# Patient Record
Sex: Female | Born: 1995 | Race: Black or African American | Hispanic: No | Marital: Single | Smoking: Current some day smoker
Health system: Southern US, Community
[De-identification: ages and names within clinical notes are randomized; demographics above are authoritative.]

## PROBLEM LIST (undated history)

## (undated) ENCOUNTER — Inpatient Hospital Stay (HOSPITAL_COMMUNITY): Payer: Self-pay

## (undated) DIAGNOSIS — Z789 Other specified health status: Secondary | ICD-10-CM

## (undated) HISTORY — DX: Other specified health status: Z78.9

## (undated) HISTORY — PX: NO PAST SURGERIES: SHX2092

---
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
## 2016-06-11 ENCOUNTER — Ambulatory Visit: Payer: Medicaid Other | Admitting: Family Medicine

## 2016-08-28 ENCOUNTER — Encounter (HOSPITAL_COMMUNITY): Payer: Self-pay | Admitting: *Deleted

## 2016-08-28 ENCOUNTER — Emergency Department (HOSPITAL_COMMUNITY)
Admission: EM | Admit: 2016-08-28 | Discharge: 2016-08-28 | Disposition: A | Discharge status: Home / Self Care | Payer: Self-pay | Attending: Emergency Medicine | Admitting: Emergency Medicine

## 2016-08-28 DIAGNOSIS — R202 Paresthesia of skin: Secondary | ICD-10-CM | POA: Insufficient documentation

## 2016-08-28 DIAGNOSIS — Z79899 Other long term (current) drug therapy: Secondary | ICD-10-CM | POA: Insufficient documentation

## 2016-08-28 LAB — RAPID URINE DRUG SCREEN, HOSP PERFORMED
AMPHETAMINES: NOT DETECTED
BENZODIAZEPINES: NOT DETECTED
Barbiturates: NOT DETECTED
Cocaine: NOT DETECTED
OPIATES: NOT DETECTED
TETRAHYDROCANNABINOL: POSITIVE — AB

## 2016-08-28 LAB — I-STAT CHEM 8, ED
BUN: 17 mg/dL (ref 6–20)
CALCIUM ION: 1.27 mmol/L (ref 1.15–1.40)
Chloride: 104 mmol/L (ref 101–111)
Creatinine, Ser: 0.6 mg/dL (ref 0.44–1.00)
Glucose, Bld: 105 mg/dL — ABNORMAL HIGH (ref 65–99)
HCT: 43 % (ref 36.0–46.0)
Hemoglobin: 14.6 g/dL (ref 12.0–15.0)
POTASSIUM: 3.6 mmol/L (ref 3.5–5.1)
Sodium: 140 mmol/L (ref 135–145)
TCO2: 25 mmol/L (ref 0–100)

## 2016-08-28 LAB — PREGNANCY, URINE: Preg Test, Ur: NEGATIVE

## 2016-08-28 NOTE — ED Triage Notes (Signed)
Patient is alert and oriented x4.  She is complaining of numbness in her toes that started earlier this morning.  Patient was calm on arrival to the ED but began to hyperventilate.  Patient continues to have numbness in her toes.  Patient has ROM of motion with all extremities but is still with her movements.

## 2016-08-28 NOTE — ED Provider Notes (Signed)
[facility]-EMERGENCY DEPT Provider Note   CSN: [id number] Arrival date & time: 08/28/16  0510     History   Chief Complaint Chief Complaint  Patient presents with  . Anxiety    HPI Brianna Dawson is a 20 y.o. female.  HPI Patient presents with numbness and weakness in her feet. States this morning she got home from work late. She smokes 2 "blacks". She was then going into her bedroom when the phone runoff. States she went to go get the phone and she had weakness and numbness in her legs. States she had trouble walking. States it felt as if they were asleep. States she became kind of anxious about this. No headache. No confusion. She has not had episodes like this before. No shortness of breath. No new medications. No recent illnesses. Did not involve her hands. States she felt as if she was syncopal and be able to get up. No neck pain. Back pain.   Past Medical History:  Diagnosis Date  . Medical history non-contributory     Patient Active Problem List   Diagnosis Date Noted  . Contraception, device intrauterine 10/14/2013  . Wheezing without diagnosis of asthma 06/29/2013    History reviewed. No pertinent surgical history.  OB History    Gravida Para Term Preterm AB Living   1 1 1  0 0 1   SAB TAB Ectopic Multiple Live Births   0 0 0 0 1       Home Medications    Prior to Admission medications   Medication Sig Start Date End Date Taking? Authorizing Provider  albuterol (PROVENTIL HFA;VENTOLIN HFA) 108 (90 BASE) MCG/ACT inhaler Inhale 2 puffs into the lungs every 6 (six) hours as needed for wheezing. Patient not taking: Reported on 08/28/2016 06/22/13   Marlis Edelson, CNM  cetirizine (ZYRTEC ALLERGY) 10 MG tablet Take 1 tablet (10 mg total) by mouth daily. Patient not taking: Reported on 08/28/2016 07/13/13   Marlis Edelson, CNM  ferrous sulfate 325 (65 FE) MG tablet Take 1 tablet (325 mg total) by mouth daily with breakfast. Patient not taking: Reported on 08/28/2016  07/06/13   Aviva Signs, CNM  fluticasone (FLOVENT HFA) 44 MCG/ACT inhaler Inhale 1 puff into the lungs 2 (two) times daily. Patient not taking: Reported on 08/28/2016 06/22/13   Marlis Edelson, CNM  ibuprofen (ADVIL,MOTRIN) 600 MG tablet Take 1 tablet (600 mg total) by mouth every 6 (six) hours. Patient not taking: Reported on 08/28/2016 09/15/13   Charlane Ferretti, MD  Prenatal Vit-Fe Fumarate-FA (PRENATAL COMPLETE) 14-0.4 MG TABS Take 1 tablet by mouth daily. Patient not taking: Reported on 08/28/2016 05/13/13   Marcellina Millin, MD    Family History Family History  Problem Relation Age of Onset  . Hypertension Maternal Grandfather     Social History Social History  Substance Use Topics  . Smoking status: Never Smoker  . Smokeless tobacco: Never Used  . Alcohol use No     Allergies   Review of patient's allergies indicates no known allergies.   Review of Systems Review of Systems  Constitutional: Negative for appetite change.  HENT: Negative for congestion.   Respiratory: Negative for shortness of breath.   Cardiovascular: Negative for chest pain.  Gastrointestinal: Negative for abdominal pain.  Genitourinary: Negative for flank pain.  Musculoskeletal: Negative for back pain.  Neurological: Positive for weakness and numbness.  Psychiatric/Behavioral: Negative for behavioral problems.     Physical Exam Updated Vital Signs BP 104/67 (BP Location:  Left Arm)   Pulse 98   Temp 98.6 F (37 C) (Oral)   Resp 18   Ht 5\' 6"  (1.676 m)   Wt 150 lb (68 kg)   LMP 07/29/2016 (Approximate)   SpO2 97%   BMI 24.21 kg/m   Physical Exam  Constitutional: She appears well-developed.  HENT:  Head: Atraumatic.  Eyes: EOM are normal.  Neck: Neck supple.  Cardiovascular: Normal rate.   Pulmonary/Chest: Effort normal.  Abdominal: Soft.  Musculoskeletal:  Good dorsalis pedis pulse in both feet. Good flexion-extension of the ankle. Patient states some pain with movement of the  calf. Good capillary refill. Paresthesias in a stocking distribution on both lower legs and feet. No increased reflexes.  Neurological: She is alert.  Skin: Skin is warm. Capillary refill takes less than 2 seconds.  Psychiatric: She has a normal mood and affect.     ED Treatments / Results  Labs (all labs ordered are listed, but only abnormal results are displayed) Labs Reviewed  RAPID URINE DRUG SCREEN, HOSP PERFORMED - Abnormal; Notable for the following:       Result Value   Tetrahydrocannabinol POSITIVE (*)    All other components within normal limits  I-STAT CHEM 8, ED - Abnormal; Notable for the following:    Glucose, Bld 105 (*)    All other components within normal limits  PREGNANCY, URINE    EKG  EKG Interpretation None       Radiology No results found.  Procedures Procedures (including critical care time)  Medications Ordered in ED Medications - No data to display   Initial Impression / Assessment and Plan / ED Course  I have reviewed the triage vital signs and the nursing notes.  Pertinent labs & imaging results that were available during my care of the patient were reviewed by me and considered in my medical decision making (see chart for details).  Clinical Course    Patient with paresthesias in her feet. Labs reassuring. Exam improving. Is a stocking distribution. No weakness. Will discharge home to follow-up with neurology as needed.  Final Clinical Impressions(s) / ED Diagnoses   Final diagnoses:  Paresthesias    New Prescriptions New Prescriptions   No medications on file     Benjiman CoreNathan Aurielle Slingerland, MD 08/28/16 [phone]

## 2016-08-28 NOTE — ED Notes (Signed)
Bed: WTR8 Expected date:  Expected time:  Means of arrival:  Comments: 

## 2016-08-28 NOTE — ED Notes (Signed)
Patient is alert and oriented x3.  She was given DC instructions and follow up visit instructions.  Patient gave verbal understanding. She was DC ambulatory under her own power to home.  V/S stable.  He was not showing any signs of distress on DC 

## 2017-11-03 NOTE — L&D Delivery Note (Addendum)
Patient: Brianna Dawson MRN: [id number]  GBS status: neg, IAP given n/a  Patient is a 22 y.o. now G2P2002 s/p NSVD at 6486w2d, who was admitted for SOL. SROM 1h 5822m prior to delivery with clear fluid.    Delivery Note At 1:28 AM a healthy female was delivered via Vaginal, Spontaneous (Presentation: compound R hand, ROA;  ).  APGAR: 8, 9; weight  pending.   Placenta status: spontaneous, intact. 3-vessel cord  Anesthesia:  none Episiotomy: None Lacerations: None Suture Repair: none Est. Blood Loss (mL): 50  Mom to postpartum.  Baby to Couplet care / Skin to Skin.  Denzil HughesKelsey D Ash 07/18/2018, 1:50 AM  Midwife attestation: I was gloved and present for delivery in its entirety and I agree with the above resident's note.  Donette LarryMelanie Yolanda Dockendorf, CNM 1:58 AM     Head delivered ROA. No nuchal cord present. Shoulder and body delivered in usual fashion. Infant with spontaneous cry, placed on mother's abdomen, dried and bulb suctioned. Cord clamped x 2 after 1-minute delay, and cut by family member. Cord blood drawn. Placenta delivered spontaneously with gentle cord traction. Fundus firm with massage and Pitocin. Perineum inspected and found to have no laceration.

## 2017-11-28 ENCOUNTER — Encounter (HOSPITAL_COMMUNITY): Payer: Self-pay | Admitting: Emergency Medicine

## 2017-11-28 ENCOUNTER — Emergency Department (HOSPITAL_COMMUNITY)
Admission: EM | Admit: 2017-11-28 | Discharge: 2017-11-28 | Disposition: A | Discharge status: Home / Self Care | Payer: Medicaid Other | Attending: Emergency Medicine | Admitting: Emergency Medicine

## 2017-11-28 DIAGNOSIS — Z3A01 Less than 8 weeks gestation of pregnancy: Secondary | ICD-10-CM | POA: Diagnosis not present

## 2017-11-28 DIAGNOSIS — O26891 Other specified pregnancy related conditions, first trimester: Secondary | ICD-10-CM | POA: Insufficient documentation

## 2017-11-28 LAB — LIPASE, BLOOD: Lipase: 26 U/L (ref 11–51)

## 2017-11-28 LAB — COMPREHENSIVE METABOLIC PANEL
ALBUMIN: 3.9 g/dL (ref 3.5–5.0)
ALT: 11 U/L — ABNORMAL LOW (ref 14–54)
ANION GAP: 7 (ref 5–15)
AST: 15 U/L (ref 15–41)
Alkaline Phosphatase: 51 U/L (ref 38–126)
BILIRUBIN TOTAL: 0.6 mg/dL (ref 0.3–1.2)
BUN: 11 mg/dL (ref 6–20)
CHLORIDE: 102 mmol/L (ref 101–111)
CO2: 25 mmol/L (ref 22–32)
Calcium: 9.1 mg/dL (ref 8.9–10.3)
Creatinine, Ser: 0.6 mg/dL (ref 0.44–1.00)
GFR calc Af Amer: >60 mL/min (ref >60)
GFR calc non Af Amer: >60 mL/min (ref >60)
GLUCOSE: 86 mg/dL (ref 65–99)
POTASSIUM: 3.8 mmol/L (ref 3.5–5.1)
SODIUM: 134 mmol/L — AB (ref 135–145)
TOTAL PROTEIN: 7.1 g/dL (ref 6.5–8.1)

## 2017-11-28 LAB — URINALYSIS, ROUTINE W REFLEX MICROSCOPIC
BILIRUBIN URINE: NEGATIVE
GLUCOSE, UA: NEGATIVE mg/dL
Hgb urine dipstick: NEGATIVE
Ketones, ur: NEGATIVE mg/dL
Leukocytes, UA: NEGATIVE
NITRITE: NEGATIVE
PH: 8 (ref 5.0–8.0)
Protein, ur: NEGATIVE mg/dL
SPECIFIC GRAVITY, URINE: 1.016 (ref 1.005–1.030)

## 2017-11-28 LAB — HCG, QUANTITATIVE, PREGNANCY: hCG, Beta Chain, Quant, S: 27958 m[IU]/mL — ABNORMAL HIGH (ref <5)

## 2017-11-28 LAB — CBC
HEMATOCRIT: 39.2 % (ref 36.0–46.0)
HEMOGLOBIN: 13.1 g/dL (ref 12.0–15.0)
MCH: 23.6 pg — ABNORMAL LOW (ref 26.0–34.0)
MCHC: 33.4 g/dL (ref 30.0–36.0)
MCV: 70.6 fL — AB (ref 78.0–100.0)
Platelets: 193 10*3/uL (ref 150–400)
RBC: 5.55 MIL/uL — ABNORMAL HIGH (ref 3.87–5.11)
RDW: 14.8 % (ref 11.5–15.5)
WBC: 7.8 10*3/uL (ref 4.0–10.5)

## 2017-11-28 NOTE — ED Notes (Signed)
Bed: WTR6 Expected date:  Expected time:  Means of arrival:  Comments: 

## 2017-11-28 NOTE — ED Triage Notes (Signed)
Patient reports that she recently found out that she was pregnant and hasnt gotten in to see MD yet. Reports intermittent lower abd cramping over past couple days with any vaginal bleeding, v/d or urinary problems. Reports some intermittent nausea.

## 2017-11-28 NOTE — Discharge Instructions (Signed)
If you develop pain or bleeding [facility].

## 2017-11-28 NOTE — ED Triage Notes (Signed)
Pt currently denies any vaginal bleeding, reports slight milky vaginal discharge. Nausea and cramping are intermittent

## 2017-11-28 NOTE — ED Provider Notes (Signed)
[facility]-EMERGENCY DEPT Provider Note   CSN: [id number] Arrival date & time: 11/28/17  1118     History   Chief Complaint Chief Complaint  Patient presents with  . Abdominal Cramping  . recently found out pregnant    HPI Brianna Dawson is a 22 y.o. G2P1001 early pregnant, LMP 10/16/17  who presents to the ED with abdominal cramping and nausea. Patient reports she just recently found out she was pregnant and has not started prenatal care. She describes the abdominal pain as cramping that comes and goes. The symptoms started 2 days ago. Patient denies vaginal bleeding or discharge, no UTI symptoms. No hx of STI and no concern for STI. Current sex partner x 3 years. Planned pregnancy. Patient denies pain currently.  HPI  Past Medical History:  Diagnosis Date  . Medical history non-contributory     Patient Active Problem List   Diagnosis Date Noted  . Contraception, device intrauterine 10/14/2013  . Wheezing without diagnosis of asthma 06/29/2013    History reviewed. No pertinent surgical history.  OB History    Gravida Para Term Preterm AB Living   2 1 1  0 0 1   SAB TAB Ectopic Multiple Live Births   0 0 0 0 1       Home Medications    Prior to Admission medications   Medication Sig Start Date End Date Taking? Authorizing Provider  ferrous sulfate 325 (65 FE) MG tablet Take 1 tablet (325 mg total) by mouth daily with breakfast. Patient not taking: Reported on 08/28/2016 07/06/13   Aviva SignsWilliams, Marie L, CNM  Prenatal Vit-Fe Fumarate-FA (PRENATAL COMPLETE) 14-0.4 MG TABS Take 1 tablet by mouth daily. Patient not taking: Reported on 08/28/2016 05/13/13   Marcellina MillinGaley, Timothy, MD    Family History Family History  Problem Relation Age of Onset  . Hypertension Maternal Grandfather     Social History Social History   Tobacco Use  . Smoking status: Never Smoker  . Smokeless tobacco: Never Used  Substance Use Topics  . Alcohol use: No  . Drug use: No       Allergies   Patient has no known allergies.   Review of Systems Review of Systems  Constitutional: Negative for chills.  HENT: Negative.   Eyes: Negative for visual disturbance.  Respiratory: Negative for cough and shortness of breath.   Cardiovascular: Negative for chest pain.  Gastrointestinal: Positive for nausea. Negative for vomiting. Abdominal pain: cramping.  Genitourinary: Positive for frequency. Negative for dysuria, urgency, vaginal bleeding and vaginal discharge.  Musculoskeletal: Negative for back pain.  Skin: Negative for rash and wound.  Neurological: Negative for syncope and headaches.  Psychiatric/Behavioral: Negative for confusion.     Physical Exam Updated Vital Signs BP 127/69 (BP Location: Left Arm)   Pulse 86   Temp 98 F (36.7 C) (Oral)   Resp 18   LMP 11/16/2016   SpO2 99%   Physical Exam  Constitutional: She is oriented to person, place, and time. She appears well-developed and well-nourished. No distress.  HENT:  Head: Normocephalic and atraumatic.  Eyes: Conjunctivae and EOM are normal.  Neck: Normal range of motion. Neck supple.  Cardiovascular: Normal rate and regular rhythm.  Pulmonary/Chest: Effort normal and breath sounds normal.  Abdominal: Soft. Bowel sounds are normal. There is no tenderness.  Musculoskeletal: Normal range of motion.  Neurological: She is alert and oriented to person, place, and time. No cranial nerve deficit.  Skin: Skin is warm and dry.  Psychiatric:  She has a normal mood and affect. Her behavior is normal.  Nursing note and vitals reviewed.    ED Treatments / Results  Labs (all labs ordered are listed, but only abnormal results are displayed) Labs Reviewed  COMPREHENSIVE METABOLIC PANEL - Abnormal; Notable for the following components:      Result Value   Sodium 134 (*)    ALT 11 (*)    All other components within normal limits  CBC - Abnormal; Notable for the following components:   RBC 5.55 (*)     MCV 70.6 (*)    MCH 23.6 (*)    All other components within normal limits  HCG, QUANTITATIVE, PREGNANCY - Abnormal; Notable for the following components:   hCG, Beta Chain, Quant, S 27,958 (*)    All other components within normal limits  LIPASE, BLOOD  URINALYSIS, ROUTINE W REFLEX MICROSCOPIC    Radiology No results found.  Procedures Procedures (including critical care time)  Medications Ordered in ED Medications - No data to display   Initial Impression / Assessment and Plan / ED Course  I have reviewed the triage vital signs and the nursing notes. 21 y.o. G2P1001 @ 6 weeks 1 day gestation by LMP with occasional lower abdominal cramping and no pain currently. Offered patient ultrasound due to the cramping. Patient declined ultrasound stating she is not having pain now and needs to leave to go to work. She does want to know how far along she is and get a pregnancy verification letter. Patient given information that with LMP of 10/16/17 she would be 6 weeks and 1 day pregnant. Pregnancy verification letter given. Patient appears safe for d/c. Return precautions discussed. If patient develops problems she will go to [facility] hospital.   Final Clinical Impressions(s) / ED Diagnoses   Final diagnoses:  Less than [redacted] weeks gestation of pregnancy    ED Discharge Orders    None       Kerrie Buffalo Axson, NP 11/28/17 1426    Lorre Nick, MD 11/29/17 1004

## 2017-12-03 ENCOUNTER — Telehealth: Payer: Self-pay | Admitting: Family Medicine

## 2017-12-03 NOTE — Telephone Encounter (Signed)
Patient called [facility] to make an appointment. Called patient back to schedule, and no answer.

## 2017-12-22 ENCOUNTER — Encounter: Payer: Self-pay | Admitting: Obstetrics and Gynecology

## 2017-12-22 ENCOUNTER — Ambulatory Visit: Payer: Self-pay

## 2017-12-22 ENCOUNTER — Ambulatory Visit (INDEPENDENT_AMBULATORY_CARE_PROVIDER_SITE_OTHER): Payer: Medicaid Other | Admitting: Obstetrics and Gynecology

## 2017-12-22 ENCOUNTER — Other Ambulatory Visit (HOSPITAL_COMMUNITY)
Admission: RE | Admit: 2017-12-22 | Discharge: 2017-12-22 | Disposition: A | Discharge status: Home / Self Care | Payer: Medicaid Other | Source: Ambulatory Visit | Attending: Obstetrics and Gynecology | Admitting: Obstetrics and Gynecology

## 2017-12-22 VITALS — BP 113/64 | HR 87 | Wt 158.0 lb

## 2017-12-22 DIAGNOSIS — Z3491 Encounter for supervision of normal pregnancy, unspecified, first trimester: Secondary | ICD-10-CM

## 2017-12-22 DIAGNOSIS — Z3490 Encounter for supervision of normal pregnancy, unspecified, unspecified trimester: Secondary | ICD-10-CM | POA: Insufficient documentation

## 2017-12-22 NOTE — Patient Instructions (Signed)
First Trimester of Pregnancy The first trimester of pregnancy is from week 1 until the end of week 13 (months 1 through 3). During this time, your baby will begin to develop inside you. At 6-8 weeks, the eyes and face are formed, and the heartbeat can be seen on ultrasound. At the end of 12 weeks, all the baby's organs are formed. Prenatal care is all the medical care you receive before the birth of your baby. Make sure you get good prenatal care and follow all of your doctor's instructions. Follow these instructions at home: Medicines  Take over-the-counter and prescription medicines only as told by your doctor. Some medicines are safe and some medicines are not safe during pregnancy.  Take a prenatal vitamin that contains at least 600 micrograms (mcg) of folic acid.  If you have trouble pooping (constipation), take medicine that will make your stool soft (stool softener) if your doctor approves. Eating and drinking  Eat regular, healthy meals.  Your doctor will tell you the amount of weight gain that is right for you.  Avoid raw meat and uncooked cheese.  If you feel sick to your stomach (nauseous) or throw up (vomit): ? Eat 4 or 5 small meals a day instead of 3 large meals. ? Try eating a few soda crackers. ? Drink liquids between meals instead of during meals.  To prevent constipation: ? Eat foods that are high in fiber, like fresh fruits and vegetables, whole grains, and beans. ? Drink enough fluids to keep your pee (urine) clear or pale yellow. Activity  Exercise only as told by your doctor. Stop exercising if you have cramps or pain in your lower belly (abdomen) or low back.  Do not exercise if it is too hot, too humid, or if you are in a place of great height (high altitude).  Try to avoid standing for long periods of time. Move your legs often if you must stand in one place for a long time.  Avoid heavy lifting.  Wear low-heeled shoes. Sit and stand up straight.  You  can have sex unless your doctor tells you not to. Relieving pain and discomfort  Wear a good support bra if your breasts are sore.  Take warm water baths (sitz baths) to soothe pain or discomfort caused by hemorrhoids. Use hemorrhoid cream if your doctor says it is okay.  Rest with your legs raised if you have leg cramps or low back pain.  If you have puffy, bulging veins (varicose veins) in your legs: ? Wear support hose or compression stockings as told by your doctor. ? Raise (elevate) your feet for 15 minutes, 3-4 times a day. ? Limit salt in your food. Prenatal care  Schedule your prenatal visits by the twelfth week of pregnancy.  Write down your questions. Take them to your prenatal visits.  Keep all your prenatal visits as told by your doctor. This is important. Safety  Wear your seat belt at all times when driving.  Make a list of emergency phone numbers. The list should include numbers for family, friends, the hospital, and police and fire departments. General instructions  Ask your doctor for a referral to a local prenatal class. Begin classes no later than at the start of month 6 of your pregnancy.  Ask for help if you need counseling or if you need help with nutrition. Your doctor can give you advice or tell you where to go for help.  Do not use hot tubs, steam rooms, or   saunas.  Do not douche or use tampons or scented sanitary pads.  Do not cross your legs for long periods of time.  Avoid all herbs and alcohol. Avoid drugs that are not approved by your doctor.  Do not use any tobacco products, including cigarettes, chewing tobacco, and electronic cigarettes. If you need help quitting, ask your doctor. You may get counseling or other support to help you quit.  Avoid cat litter boxes and soil used by cats. These carry germs that can cause birth defects in the baby and can cause a loss of your baby (miscarriage) or stillbirth.  Visit your dentist. At home, brush  your teeth with a soft toothbrush. Be gentle when you floss. Contact a doctor if:  You are dizzy.  You have mild cramps or pressure in your lower belly.  You have a nagging pain in your belly area.  You continue to feel sick to your stomach, you throw up, or you have watery poop (diarrhea).  You have a bad smelling fluid coming from your vagina.  You have pain when you pee (urinate).  You have increased puffiness (swelling) in your face, hands, legs, or ankles. Get help right away if:  You have a fever.  You are leaking fluid from your vagina.  You have spotting or bleeding from your vagina.  You have very bad belly cramping or pain.  You gain or lose weight rapidly.  You throw up blood. It may look like coffee grounds.  You are around people who have German measles, fifth disease, or chickenpox.  You have a very bad headache.  You have shortness of breath.  You have any kind of trauma, such as from a fall or a car accident. Summary  The first trimester of pregnancy is from week 1 until the end of week 13 (months 1 through 3).  To take care of yourself and your unborn baby, you will need to eat healthy meals, take medicines only if your doctor tells you to do so, and do activities that are safe for you and your baby.  Keep all follow-up visits as told by your doctor. This is important as your doctor will have to ensure that your baby is healthy and growing well. This information is not intended to replace advice given to you by your health care provider. Make sure you discuss any questions you have with your health care provider. Document Released: 04/07/2008 Document Revised: 10/28/2016 Document Reviewed: 10/28/2016 Elsevier Interactive Patient Education  2017 Elsevier Inc.  

## 2017-12-22 NOTE — Progress Notes (Signed)
Bedside ultrasound reveals FHR 160

## 2017-12-22 NOTE — Progress Notes (Signed)
New OB Note  12/22/2017   CC:  Chief Complaint  Patient presents with  . Initial Prenatal Visit    Transfer of Care Patient: no  History of Present Illness: Brianna Dawson is a 22 y.o. G2P1001 at 586w4d by estimated LMP being seen today for her first obstetrical visit. Her obstetrical history is significant for none. This was a planned pregnancy. Patient does intend to breast feed. Patient undecided for contraception after completion of pregnancy. Pregnancy history fully reviewed.  Her periods were: regular  She was using no method when she conceived.  She has Negative signs or symptoms of nausea/vomiting of pregnancy. She has Negative signs or symptoms of miscarriage or preterm labor  Patient reports no complaints.  Any prior children are healthy, doing well, without any problems or issues: no Complications in prior pregnancies: no  Complications in prior deliveries: no   ROS: A 12-point review of systems was performed and negative, except as stated in the above HPI.   OBGYN History: OB History  Gravida Para Term Preterm AB Living  2 1 1  0 0 1  SAB TAB Ectopic Multiple Live Births  0 0 0 0 1    # Outcome Date GA Lbr Len/2nd Weight Sex Delivery Anes PTL Lv  2 Current           1 Term 09/14/13 3166w1d 16:02 / 00:12 7 lb 1.8 oz (3.226 kg) F Vag-Spont EPI  LIV      GYN Hx: Never had one <22yo  Past Medical History: Past Medical History:  Diagnosis Date  . Medical history non-contributory     Past Surgical History: History reviewed. No pertinent surgical history.  Family History:  Family History  Problem Relation Age of Onset  . Hypertension Maternal Grandfather     Social History:  Social History   Socioeconomic History  . Marital status: Single    Spouse name: Not on file  . Number of children: Not on file  . Years of education: Not on file  . Highest education level: Not on file  Social Needs  . Financial resource strain: Not on file  . Food  insecurity - worry: Not on file  . Food insecurity - inability: Not on file  . Transportation needs - medical: Not on file  . Transportation needs - non-medical: Not on file  Occupational History  . Not on file  Tobacco Use  . Smoking status: Former Games developermoker  . Smokeless tobacco: Never Used  Substance and Sexual Activity  . Alcohol use: No  . Drug use: No  . Sexual activity: Yes    Birth control/protection: None  Other Topics Concern  . Not on file  Social History Narrative  . Not on file    Allergy: No Known Allergies  Current Outpatient Medications:  Current Outpatient Medications:  .  ferrous sulfate 325 (65 FE) MG tablet, Take 1 tablet (325 mg total) by mouth daily with breakfast. (Patient not taking: Reported on 08/28/2016), Disp: 30 tablet, Rfl: 2 .  Prenatal Vit-Fe Fumarate-FA (PRENATAL COMPLETE) 14-0.4 MG TABS, Take 1 tablet by mouth daily. (Patient not taking: Reported on 08/28/2016), Disp: 30 each, Rfl: 5  Physical Exam:   BP 113/64   Pulse 87   Wt 158 lb (71.7 kg)   LMP 10/16/2017 (Approximate)   BMI 25.50 kg/m  Body mass index is 25.5 kg/m. Contractions: Not present Vag. Bleeding: None. FHTs: 160bpm via bedside US  General appearance: Well nourished, well developed female  in no acute distress.  Neck:  Supple, normal appearance, and no thyromegaly  Cardiovascular: regular rate and rhythm Respiratory:  Clear to auscultation bilateral. Normal respiratory effort Abdomen: positive bowel sounds and no masses, hernias; diffusely non tender to palpation, non distended Breasts: breasts appear normal, no suspicious masses, no skin or nipple changes or axillary nodes. Neuro/Psych:  Normal mood and affect.  Skin:  Warm and dry.  Lymphatic:  No inguinal lymphadenopathy.  Genitalia:  Normal introitus for age, no external lesions, no vaginal discharge, mucosa pink and moist, no vaginal or cervical lesions, no vaginal atrophy, no friaility or hemorrhage, normal uterus  size and position, no adnexal masses or tenderness  Assessment/Plan: G2P1001 [redacted]w[redacted]d  1. Encounter for supervision of low-risk pregnancy in first trimester - Obstetric Panel, Including HIV - SMN1 COPY NUMBER ANALYSIS (SMA Carrier Screen) - Hemoglobinopathy Evaluation - Culture, OB Urine - US OB Limited; Future - GC/Chlamydia probe amp ([place])not at [facility] - US OB Transvaginal; Future   Initial labs drawn. Continue prenatal vitamins. Genetic Screening discussed, First trimester screen: ordered. Ultrasound discussed; fetal anatomic survey: requested at future date. Order placed for dating [place] since patient with unknown LMP Problem list reviewed and updated. The nature of [facility] with multiple MDs and other Advanced Practice Providers was explained to patient; also emphasized that residents, students are part of our team. Routine obstetric precautions reviewed. Return in about 4 weeks (around 01/19/2018) for ob visit.    Caryl Ada, DO OB Fellow Center for [facility], [facility]

## 2017-12-23 ENCOUNTER — Encounter: Payer: Self-pay | Admitting: Obstetrics and Gynecology

## 2017-12-23 LAB — GC/CHLAMYDIA PROBE AMP (~~LOC~~) NOT AT ARMC
CHLAMYDIA, DNA PROBE: NEGATIVE
Neisseria Gonorrhea: NEGATIVE

## 2017-12-24 LAB — URINE CULTURE, OB REFLEX: Organism ID, Bacteria: NO GROWTH

## 2017-12-24 LAB — CULTURE, OB URINE

## 2017-12-25 ENCOUNTER — Ambulatory Visit (HOSPITAL_COMMUNITY): Admission: RE | Admit: 2017-12-25 | Payer: Medicaid Other | Source: Ambulatory Visit

## 2017-12-31 LAB — OBSTETRIC PANEL, INCLUDING HIV
ANTIBODY SCREEN: NEGATIVE
Basophils Absolute: 0 10*3/uL (ref 0.0–0.2)
Basos: 0 %
EOS (ABSOLUTE): 0.1 10*3/uL (ref 0.0–0.4)
Eos: 2 %
HIV Screen 4th Generation wRfx: NONREACTIVE
Hematocrit: 41.8 % (ref 34.0–46.6)
Hemoglobin: 13.3 g/dL (ref 11.1–15.9)
Hepatitis B Surface Ag: NEGATIVE
IMMATURE GRANS (ABS): 0 10*3/uL (ref 0.0–0.1)
IMMATURE GRANULOCYTES: 0 %
LYMPHS ABS: 2 10*3/uL (ref 0.7–3.1)
LYMPHS: 27 %
MCH: 23.5 pg — ABNORMAL LOW (ref 26.6–33.0)
MCHC: 31.8 g/dL (ref 31.5–35.7)
MCV: 74 fL — ABNORMAL LOW (ref 79–97)
MONOS ABS: 0.6 10*3/uL (ref 0.1–0.9)
Monocytes: 8 %
NEUTROS PCT: 63 %
Neutrophils Absolute: 4.4 10*3/uL (ref 1.4–7.0)
Platelets: 294 10*3/uL (ref 150–379)
RBC: 5.66 x10E6/uL — AB (ref 3.77–5.28)
RDW: 15.2 % (ref 12.3–15.4)
RPR Ser Ql: NONREACTIVE
Rh Factor: POSITIVE
Rubella Antibodies, IGG: 12.7 index (ref >0.99)
WBC: 7.1 10*3/uL (ref 3.4–10.8)

## 2017-12-31 LAB — HEMOGLOBINOPATHY EVALUATION
Ferritin: 289 ng/mL — ABNORMAL HIGH (ref 15–150)
HGB A: 97.5 % (ref 96.4–98.8)
HGB S: 0 %
HGB SOLUBILITY: NEGATIVE
Hgb A2 Quant: 2.5 % (ref 1.8–3.2)
Hgb C: 0 %
Hgb F Quant: 0 % (ref 0.0–2.0)
Hgb Variant: 0 %

## 2017-12-31 LAB — SMN1 COPY NUMBER ANALYSIS (SMA CARRIER SCREENING)

## 2017-12-31 LAB — ALPHA-THALASSEMIA

## 2018-01-08 ENCOUNTER — Encounter: Payer: Self-pay | Admitting: Family Medicine

## 2018-01-08 DIAGNOSIS — Z3491 Encounter for supervision of normal pregnancy, unspecified, first trimester: Secondary | ICD-10-CM

## 2018-01-18 ENCOUNTER — Telehealth: Payer: Self-pay | Admitting: Advanced Practice Midwife

## 2018-01-18 ENCOUNTER — Encounter: Payer: Self-pay | Admitting: Advanced Practice Midwife

## 2018-01-18 NOTE — Telephone Encounter (Signed)
Called patient about her missed appointment. Sending certified letter for rescheduled date and time.

## 2018-01-28 ENCOUNTER — Encounter: Payer: Medicaid Other | Admitting: Student

## 2018-02-18 ENCOUNTER — Encounter: Payer: Medicaid Other | Admitting: Student

## 2018-02-19 ENCOUNTER — Encounter (HOSPITAL_COMMUNITY): Payer: Self-pay | Admitting: Emergency Medicine

## 2018-02-19 ENCOUNTER — Emergency Department (HOSPITAL_COMMUNITY)
Admission: EM | Admit: 2018-02-19 | Discharge: 2018-02-19 | Discharge status: Against Medical Advice | Payer: Medicaid Other | Attending: Emergency Medicine | Admitting: Emergency Medicine

## 2018-02-19 DIAGNOSIS — Z5321 Procedure and treatment not carried out due to patient leaving prior to being seen by health care provider: Secondary | ICD-10-CM | POA: Insufficient documentation

## 2018-02-19 DIAGNOSIS — R109 Unspecified abdominal pain: Secondary | ICD-10-CM | POA: Diagnosis present

## 2018-02-19 LAB — URINALYSIS, ROUTINE W REFLEX MICROSCOPIC
BILIRUBIN URINE: NEGATIVE
Glucose, UA: NEGATIVE mg/dL
Hgb urine dipstick: NEGATIVE
KETONES UR: 5 mg/dL — AB
Nitrite: NEGATIVE
PH: 5 (ref 5.0–8.0)
Protein, ur: 30 mg/dL — AB
Specific Gravity, Urine: 1.033 — ABNORMAL HIGH (ref 1.005–1.030)

## 2018-02-19 LAB — HCG, QUANTITATIVE, PREGNANCY: HCG, BETA CHAIN, QUANT, S: 35309 m[IU]/mL — AB (ref <5)

## 2018-02-19 NOTE — ED Notes (Signed)
Per Toniann FailWendy AD, patient reports she is leaving.

## 2018-02-19 NOTE — ED Triage Notes (Signed)
Pt reports for over 2 weeks she had pressure in bladder when she urinates and right before. reports around [redacted] weeks pregnant and missed her last 2 OB visits.

## 2018-03-08 ENCOUNTER — Ambulatory Visit (INDEPENDENT_AMBULATORY_CARE_PROVIDER_SITE_OTHER): Payer: Medicaid Other | Admitting: Advanced Practice Midwife

## 2018-03-08 VITALS — BP 110/56 | HR 77 | Wt 167.3 lb

## 2018-03-08 DIAGNOSIS — Z3482 Encounter for supervision of other normal pregnancy, second trimester: Secondary | ICD-10-CM

## 2018-03-08 DIAGNOSIS — R109 Unspecified abdominal pain: Secondary | ICD-10-CM

## 2018-03-08 DIAGNOSIS — O26892 Other specified pregnancy related conditions, second trimester: Secondary | ICD-10-CM

## 2018-03-08 MED ORDER — PRENATAL COMPLETE 14-0.4 MG PO TABS: 1.0000 | ORAL_TABLET | Freq: Every day | ORAL | 10 refills | Status: DC

## 2018-03-08 NOTE — Progress Notes (Signed)
Pt needs Rx for prenatal vitamins

## 2018-03-08 NOTE — Progress Notes (Signed)
   PRENATAL VISIT NOTE  Subjective:  Brianna Dawson is a 22 y.o. G2P1001 at [redacted]w[redacted]d being seen today for ongoing prenatal care.  She is currently monitored for the following issues for this low-risk pregnancy and has Supervision of low-risk pregnancy on their problem list.  Patient reports intermittent sharp lower abdominal pain 2-3 times daily before she urinates, pain resolves after urination.  Contractions: Not present. Vag. Bleeding: None.  Movement: Present. Denies leaking of fluid.   The following portions of the patient's history were reviewed and updated as appropriate: allergies, current medications, past family history, past medical history, past social history, past surgical history and problem list. Problem list updated.  Objective:   Vitals:   03/08/18 1720  BP: (!) 110/56  Pulse: 77  Weight: 167 lb 4.8 oz (75.9 kg)    Fetal Status: Fetal Heart Rate (bpm): 143   Movement: Present     General:  Alert, oriented and cooperative. Patient is in no acute distress.  Skin: Skin is warm and dry. No rash noted.   Cardiovascular: Normal heart rate noted  Respiratory: Normal respiratory effort, no problems with respiration noted  Abdomen: Soft, gravid, appropriate for gestational age.  Pain/Pressure: Present     Pelvic: Cervical exam deferred        Extremities: Normal range of motion.  Edema: None  Mental Status: Normal mood and affect. Normal behavior. Normal judgment and thought content.   Assessment and Plan:  Pregnancy: G2P1001 at [redacted]w[redacted]d  1. Encounter for supervision of other normal pregnancy in second trimester  - [place] MFM OB COMP + 14 WK; Future  2. Abdominal pain during pregnancy in second trimester --Pain is intermittent, 2-3 times daily and is associated with a full bladder. U/A done today in the office.  Pt was dehydrated at recent MAU visit so encouraged her to increase PO fluids.  Preterm labor symptoms and general obstetric precautions including but not limited to  vaginal bleeding, contractions, leaking of fluid and fetal movement were reviewed in detail with the patient. Please refer to After Visit Summary for other counseling recommendations.  Return in about 1 month (around 04/05/2018).  Future Appointments  Date Time Provider Department Center  04/08/2018  3:35 PM Burleson, Brand Males, NP [facility]    Sharen Counter, CNM

## 2018-03-09 ENCOUNTER — Encounter: Payer: Self-pay | Admitting: *Deleted

## 2018-03-22 ENCOUNTER — Ambulatory Visit (HOSPITAL_COMMUNITY)
Admission: RE | Admit: 2018-03-22 | Discharge: 2018-03-22 | Disposition: A | Discharge status: Home / Self Care | Payer: Medicaid Other | Source: Ambulatory Visit | Attending: Advanced Practice Midwife | Admitting: Advanced Practice Midwife

## 2018-03-22 DIAGNOSIS — Z3490 Encounter for supervision of normal pregnancy, unspecified, unspecified trimester: Secondary | ICD-10-CM

## 2018-03-22 DIAGNOSIS — Z3482 Encounter for supervision of other normal pregnancy, second trimester: Secondary | ICD-10-CM | POA: Diagnosis present

## 2018-03-22 DIAGNOSIS — Z3689 Encounter for other specified antenatal screening: Secondary | ICD-10-CM | POA: Insufficient documentation

## 2018-03-22 DIAGNOSIS — Z3A22 22 weeks gestation of pregnancy: Secondary | ICD-10-CM | POA: Diagnosis not present

## 2018-03-22 DIAGNOSIS — Z363 Encounter for antenatal screening for malformations: Secondary | ICD-10-CM | POA: Diagnosis not present

## 2018-03-24 ENCOUNTER — Encounter: Payer: Self-pay | Admitting: Advanced Practice Midwife

## 2018-03-24 DIAGNOSIS — IMO0002 Reserved for concepts with insufficient information to code with codable children: Secondary | ICD-10-CM | POA: Insufficient documentation

## 2018-03-24 DIAGNOSIS — O358XX0 Maternal care for other (suspected) fetal abnormality and damage, not applicable or unspecified: Secondary | ICD-10-CM | POA: Insufficient documentation

## 2018-04-08 ENCOUNTER — Encounter: Payer: Medicaid Other | Admitting: Nurse Practitioner

## 2018-06-28 ENCOUNTER — Telehealth: Payer: Self-pay | Admitting: Family Medicine

## 2018-06-28 ENCOUNTER — Encounter: Payer: Self-pay | Admitting: *Deleted

## 2018-06-28 ENCOUNTER — Encounter: Payer: Self-pay | Admitting: Family Medicine

## 2018-06-28 DIAGNOSIS — O358XX0 Maternal care for other (suspected) fetal abnormality and damage, not applicable or unspecified: Secondary | ICD-10-CM

## 2018-06-28 DIAGNOSIS — O35BXX0 Maternal care for other (suspected) fetal abnormality and damage, fetal cardiac anomalies, not applicable or unspecified: Secondary | ICD-10-CM

## 2018-06-28 DIAGNOSIS — Z3490 Encounter for supervision of normal pregnancy, unspecified, unspecified trimester: Secondary | ICD-10-CM

## 2018-06-28 NOTE — Telephone Encounter (Signed)
I called pt and informed her that because she has not been seen since 5/6 she has missed a lot of prenatal care. When she comes for appt on 9/10 she should be prepared to stay for 2 hours. Also she will need follow up US to review anatomy of baby before the 9/10 appt. She will be notified of US appt via MyChart. Pt agreed and voiced understanding of all instructions and information given.

## 2018-06-28 NOTE — Telephone Encounter (Signed)
Patient called to say she needed to get a referral to see a dentist. She also stated she knew she had missed a lot of her appointments. However, due to needing throes days after she delivered, she did not want to come to any appointments. She understands this is not the best thing for her and the baby, but she needs to have time after the baby comes. She wanted to know what all she had missed. I told her I would have a nurse call her back with this information. Because I'm not clinical I did not have throes answerers. I informed her I would get a nurse to call her and speak to her about this. She requested to have her next appointment September 10th.

## 2018-07-08 ENCOUNTER — Ambulatory Visit (HOSPITAL_COMMUNITY): Payer: Medicaid Other

## 2018-07-13 ENCOUNTER — Other Ambulatory Visit: Payer: Self-pay

## 2018-07-13 ENCOUNTER — Ambulatory Visit (INDEPENDENT_AMBULATORY_CARE_PROVIDER_SITE_OTHER): Payer: Medicaid Other | Admitting: Student

## 2018-07-13 ENCOUNTER — Other Ambulatory Visit: Payer: Self-pay | Admitting: Student

## 2018-07-13 ENCOUNTER — Ambulatory Visit (HOSPITAL_COMMUNITY)
Admission: RE | Admit: 2018-07-13 | Discharge: 2018-07-13 | Disposition: A | Discharge status: Home / Self Care | Payer: Medicaid Other | Source: Ambulatory Visit | Attending: Family Medicine | Admitting: Family Medicine

## 2018-07-13 VITALS — BP 119/62 | HR 79 | Wt 187.5 lb

## 2018-07-13 DIAGNOSIS — O358XX0 Maternal care for other (suspected) fetal abnormality and damage, not applicable or unspecified: Secondary | ICD-10-CM | POA: Insufficient documentation

## 2018-07-13 DIAGNOSIS — Z3493 Encounter for supervision of normal pregnancy, unspecified, third trimester: Secondary | ICD-10-CM | POA: Diagnosis not present

## 2018-07-13 DIAGNOSIS — Z23 Encounter for immunization: Secondary | ICD-10-CM | POA: Diagnosis not present

## 2018-07-13 DIAGNOSIS — Z113 Encounter for screening for infections with a predominantly sexual mode of transmission: Secondary | ICD-10-CM | POA: Diagnosis not present

## 2018-07-13 DIAGNOSIS — Z3A38 38 weeks gestation of pregnancy: Secondary | ICD-10-CM | POA: Diagnosis not present

## 2018-07-13 DIAGNOSIS — Z3490 Encounter for supervision of normal pregnancy, unspecified, unspecified trimester: Secondary | ICD-10-CM

## 2018-07-13 DIAGNOSIS — O35BXX0 Maternal care for other (suspected) fetal abnormality and damage, fetal cardiac anomalies, not applicable or unspecified: Secondary | ICD-10-CM

## 2018-07-13 DIAGNOSIS — IMO0001 Reserved for inherently not codable concepts without codable children: Secondary | ICD-10-CM

## 2018-07-13 NOTE — Progress Notes (Signed)
   PRENATAL VISIT NOTE  Subjective:  Brianna Dawson is a 22 y.o. G2P1001 at 101w4d being seen today for ongoing prenatal care.  She is currently monitored for the following issues for this low-risk pregnancy and has Supervision of low-risk pregnancy; [redacted] weeks gestation of pregnancy; Encounter for fetal anatomic survey; and Fetal cardiac echogenic focus on their problem list.  Patient reports no complaints. Patient has missed several appointments; states that she has been working.  Contractions: Not present. Vag. Bleeding: None.  Movement: Present. Denies leaking of fluid.   The following portions of the patient's history were reviewed and updated as appropriate: allergies, current medications, past family history, past medical history, past social history, past surgical history and problem list. Problem list updated.  Objective:   Vitals:   07/13/18 0835  BP: 119/62  Pulse: 79  Weight: 187 lb 8 oz (85 kg)    Fetal Status: Fetal Heart Rate (bpm): 136 Fundal Height: 36 cm Movement: Present  Presentation: Vertex  General:  Alert, oriented and cooperative. Patient is in no acute distress.  Skin: Skin is warm and dry. No rash noted.   Cardiovascular: Normal heart rate noted  Respiratory: Normal respiratory effort, no problems with respiration noted  Abdomen: Soft, gravid, appropriate for gestational age.  Pain/Pressure: Present     Pelvic: Cervical exam performed        Extremities: Normal range of motion.  Edema: Trace  Mental Status: Normal mood and affect. Normal behavior. Normal judgment and thought content.   Assessment and Plan:  Pregnancy: G2P1001 at [redacted]w[redacted]d  1. Encounter for supervision of low-risk pregnancy, antepartum -Patient does not want birth control right now, she has had mirena and didn't like it. She is scared of gaining weight with other methods.  - Culture, beta strep (group b only) - GC/Chlamydia probe amp ([place])not at [facility] - CBC - HIV antibody - RPR - Tdap  vaccine greater than or equal to 7yo IM - Glucose Tolerance, 2 Hours w/1 Hour  2. Fetal cardiac echogenic focus, single or unspecified fetus -follow up US today  Term labor symptoms and general obstetric precautions including but not limited to vaginal bleeding, contractions, leaking of fluid and fetal movement were reviewed in detail with the patient. Please refer to After Visit Summary for other counseling recommendations.  Return in about 1 week (around 07/20/2018), or LROB.  Future Appointments  Date Time Provider Department Center  07/13/2018  9:30 AM WOC-WOCA LAB WOC-WOCA WOC  07/13/2018 11:30 AM WH-MFC [place] 5 WH-MFCUS MFC-US    Marylene Land, CNM

## 2018-07-13 NOTE — Patient Instructions (Signed)
Hormonal Contraception Information °Hormonal contraception is a type of birth control that uses hormones to prevent pregnancy. It usually involves a combination of the hormones estrogen and progesterone or only the hormone progesterone. Hormonal contraception works in these ways: °· It thickens the mucus in the cervix, making it harder for sperm to enter the uterus. °· It changes the lining of the uterus, making it harder for an egg to implant. °· It may stop the ovaries from releasing eggs (ovulation). Some women who take hormonal contraceptives that contain only progesterone may continue to ovulate. ° °Hormonal contraception cannot prevent sexually transmitted infections (STIs). Pregnancy may still occur. °Estrogen and progesterone contraceptives °Contraceptives that use a combination of estrogen and progesterone are available in these forms: °· Pill. Pills come in different combinations of hormones. They must be taken at the same time each day. Pills can affect your period, causing you to get your period once every three months or not at all. °· Patch. The patch must be worn on the lower abdomen for three weeks and then removed on the fourth. °· Vaginal ring. The ring is placed in the vagina and left there for three weeks. It is then removed for one week. ° °Progesterone contraceptives °Contraceptives that use progesterone only are available in these forms: °· Pill. Pills should be taken every day of the cycle. °· Intrauterine device (IUD). This device is inserted into the uterus and removed or replaced every five years or sooner. °· Implant. Plastic rods are placed under the skin of the upper arm. They are removed or replaced every three years or sooner. °· Injection. The injection is given once every 90 days. ° °What are the side effects? °The side effects of estrogen and progesterone contraceptives include: °· Nausea. °· Headaches. °· Breast tenderness. °· Bleeding or spotting between menstrual cycles. °· High  blood pressure (rare). °· Strokes, heart attacks, or blood clots (rare) ° °Side effects of progesterone-only contraceptives include: °· Nausea. °· Headaches. °· Breast tenderness. °· Unpredictable menstrual bleeding. °· High blood pressure (rare). ° °Talk to your health care provider about what side effects may affect you. °Where to find more information: °· Ask your health care provider for more information and resources about hormonal contraception. °· U.S. Department of Health and Human Services Office on Women's Health: www.womenshealth.gov °Questions to ask: °· What type of hormonal contraception is right for me? °· How long should I plan to use hormonal contraception? °· What are the side effects of the hormonal contraception method I choose? °· How can I prevent STIs while using hormonal contraception? °Contact a health care provider if: °· You start taking hormonal contraceptives and you develop persistent or severe side effects. °Summary °· Estrogen and progesterone are hormones used in many forms of birth control. °· Talk to your health care provider about what side effects may affect you. °· Hormonal contraception cannot prevent sexually transmitted infections (STIs). °· Ask your health care provider for more information and resources about hormonal contraception. °This information is not intended to replace advice given to you by your health care provider. Make sure you discuss any questions you have with your health care provider. °Document Released: 11/09/2007 Document Revised: 09/19/2016 Document Reviewed: 09/19/2016 °Elsevier Interactive Patient Education © 2018 Elsevier Inc. ° °

## 2018-07-14 LAB — GLUCOSE TOLERANCE, 2 HOURS W/ 1HR
GLUCOSE, 2 HOUR: 54 mg/dL — AB (ref 65–152)
Glucose, 1 hour: 93 mg/dL (ref 65–179)
Glucose, Fasting: 70 mg/dL (ref 65–91)

## 2018-07-14 LAB — CBC
Hematocrit: 36 % (ref 34.0–46.6)
Hemoglobin: 11.2 g/dL (ref 11.1–15.9)
MCH: 23.7 pg — ABNORMAL LOW (ref 26.6–33.0)
MCHC: 31.1 g/dL — AB (ref 31.5–35.7)
MCV: 76 fL — AB (ref 79–97)
PLATELETS: 183 10*3/uL (ref 150–450)
RBC: 4.72 x10E6/uL (ref 3.77–5.28)
RDW: 14.2 % (ref 12.3–15.4)
WBC: 7.4 10*3/uL (ref 3.4–10.8)

## 2018-07-14 LAB — GC/CHLAMYDIA PROBE AMP (~~LOC~~) NOT AT ARMC
CHLAMYDIA, DNA PROBE: NEGATIVE
NEISSERIA GONORRHEA: NEGATIVE

## 2018-07-14 LAB — HIV ANTIBODY (ROUTINE TESTING W REFLEX): HIV Screen 4th Generation wRfx: NONREACTIVE

## 2018-07-14 LAB — RPR: RPR Ser Ql: NONREACTIVE

## 2018-07-17 ENCOUNTER — Encounter (HOSPITAL_COMMUNITY): Payer: Self-pay

## 2018-07-17 ENCOUNTER — Inpatient Hospital Stay (EMERGENCY_DEPARTMENT_HOSPITAL)
Admission: AD | Admit: 2018-07-17 | Discharge: 2018-07-17 | Disposition: A | Discharge status: Home / Self Care | Payer: Medicaid Other | Source: Ambulatory Visit | Attending: Obstetrics & Gynecology | Admitting: Obstetrics & Gynecology

## 2018-07-17 ENCOUNTER — Other Ambulatory Visit: Payer: Self-pay

## 2018-07-17 ENCOUNTER — Inpatient Hospital Stay (HOSPITAL_COMMUNITY)
Admission: AD | Admit: 2018-07-17 | Discharge: 2018-07-20 | DRG: 807 | Disposition: A | Discharge status: Home / Self Care | Payer: Medicaid Other | Attending: Obstetrics and Gynecology | Admitting: Obstetrics and Gynecology

## 2018-07-17 DIAGNOSIS — O418X30 Other specified disorders of amniotic fluid and membranes, third trimester, not applicable or unspecified: Secondary | ICD-10-CM | POA: Insufficient documentation

## 2018-07-17 DIAGNOSIS — Z3A39 39 weeks gestation of pregnancy: Secondary | ICD-10-CM | POA: Insufficient documentation

## 2018-07-17 DIAGNOSIS — Z87891 Personal history of nicotine dependence: Secondary | ICD-10-CM

## 2018-07-17 DIAGNOSIS — O9902 Anemia complicating childbirth: Principal | ICD-10-CM | POA: Diagnosis present

## 2018-07-17 DIAGNOSIS — O471 False labor at or after 37 completed weeks of gestation: Secondary | ICD-10-CM | POA: Diagnosis not present

## 2018-07-17 DIAGNOSIS — Z0371 Encounter for suspected problem with amniotic cavity and membrane ruled out: Secondary | ICD-10-CM

## 2018-07-17 DIAGNOSIS — IMO0001 Reserved for inherently not codable concepts without codable children: Secondary | ICD-10-CM

## 2018-07-17 DIAGNOSIS — O479 False labor, unspecified: Secondary | ICD-10-CM

## 2018-07-17 DIAGNOSIS — O358XX0 Maternal care for other (suspected) fetal abnormality and damage, not applicable or unspecified: Secondary | ICD-10-CM

## 2018-07-17 DIAGNOSIS — D649 Anemia, unspecified: Secondary | ICD-10-CM | POA: Diagnosis present

## 2018-07-17 LAB — POCT FERN TEST
POCT FERN TEST: NEGATIVE
POCT Fern Test: POSITIVE

## 2018-07-17 LAB — CULTURE, BETA STREP (GROUP B ONLY): Strep Gp B Culture: NEGATIVE

## 2018-07-17 NOTE — MAU Note (Signed)
Ctxs since yesterday. Leaked fld once Thurs and once yesterday. Still having some ctxs but not too bad. Some pink mucousy d/c

## 2018-07-17 NOTE — MAU Note (Signed)
I have communicated with Dr. Earlene PlaterWallace   and reviewed vital signs:  Vitals:   07/17/18 0731 07/17/18 1005  BP: 123/72 115/63  Pulse: 91 84  Resp:  18  Temp:      Vaginal exam:  Dilation: 2.5 Effacement (%): 60 Cervical Position: Posterior Station: -2 Presentation: Vertex Exam by:: Marvel PlanJessica Josep Luviano RN ,   Also reviewed contraction pattern and that non-stress test is reactive.  It has been documented that patient is contracting every 2-7 minutes with no cervical change over 1.5 hours not indicating active labor.  Patient denies any other complaints.  Based on this report provider has given order for discharge.  A discharge order and diagnosis entered by a provider.   Labor discharge instructions reviewed with patient.

## 2018-07-17 NOTE — MAU Note (Signed)
Here earlier today  2.5 cm. Ctx stronger and closer  reports some bloody show . Good fetal movement felt.

## 2018-07-17 NOTE — MAU Provider Note (Signed)
First Provider Initiated Contact with Patient 07/17/18 [phone]     S: [name] is a [age] y.o. G2P1001 at 6967w1d  who presents to MAU today complaining of leaking of fluid since yesterday- reports having a gush of fluid and discharge, denies having to wear a pad or panty liner. She denies vaginal bleeding. She endorses contractions. She reports normal fetal movement.    O: BP 123/72   Pulse 91   Temp 98.2 F (36.8 C)   Resp 18   Ht 5\' 5"  (1.651 m)   Wt 86.2 kg   LMP 10/16/2017 (Approximate)   BMI 31.62 kg/m  GENERAL: Well-developed, well-nourished female in no acute distress.  HEAD: Normocephalic, atraumatic.  CHEST: Normal effort of breathing, regular heart rate ABDOMEN: Soft, nontender, gravid PELVIC: Normal external female genitalia. Vagina is pink and rugated. Cervix with normal contour, no lesions. Bloody show present.  Negative pooling.   Cervical exam:  Dilation: 2.5 Effacement (%): 60 Cervical Position: Posterior Station: -2 Presentation: Vertex Exam by:: Steward DroneVeronica Rocquel Askren CNM    Fetal Monitoring: Baseline: 130  Variability: moderate Accelerations: present  Decelerations: none Contractions: 6-10/ moderate by palpation    A: SIUP at 7467w1d  Membranes intact  P: RN labor evaluation   Sharyon CableRogers, Nyaire Denbleyker C, CNM 07/17/2018 8:25 AM

## 2018-07-17 NOTE — Discharge Instructions (Signed)

## 2018-07-18 ENCOUNTER — Encounter (HOSPITAL_COMMUNITY): Payer: Self-pay

## 2018-07-18 DIAGNOSIS — Z3483 Encounter for supervision of other normal pregnancy, third trimester: Secondary | ICD-10-CM | POA: Diagnosis present

## 2018-07-18 DIAGNOSIS — Z87891 Personal history of nicotine dependence: Secondary | ICD-10-CM | POA: Diagnosis not present

## 2018-07-18 DIAGNOSIS — O9902 Anemia complicating childbirth: Secondary | ICD-10-CM | POA: Diagnosis present

## 2018-07-18 DIAGNOSIS — Z3A39 39 weeks gestation of pregnancy: Secondary | ICD-10-CM

## 2018-07-18 DIAGNOSIS — D649 Anemia, unspecified: Secondary | ICD-10-CM | POA: Diagnosis present

## 2018-07-18 LAB — RPR: RPR Ser Ql: NONREACTIVE

## 2018-07-18 LAB — TYPE AND SCREEN
ABO/RH(D): O POS
ANTIBODY SCREEN: NEGATIVE

## 2018-07-18 LAB — CBC
HEMATOCRIT: 36.4 % (ref 36.0–46.0)
HEMOGLOBIN: 11.6 g/dL — AB (ref 12.0–15.0)
MCH: 23.9 pg — AB (ref 26.0–34.0)
MCHC: 31.9 g/dL (ref 30.0–36.0)
MCV: 74.9 fL — AB (ref 78.0–100.0)
Platelets: 167 10*3/uL (ref 150–400)
RBC: 4.86 MIL/uL (ref 3.87–5.11)
RDW: 14.5 % (ref 11.5–15.5)
WBC: 10.3 10*3/uL (ref 4.0–10.5)

## 2018-07-18 LAB — ABO/RH: ABO/RH(D): O POS

## 2018-07-18 MED ORDER — DIPHENHYDRAMINE HCL 50 MG/ML IJ SOLN: 12.5000 mg | INTRAMUSCULAR | Status: DC | PRN

## 2018-07-18 MED ORDER — OXYCODONE-ACETAMINOPHEN 5-325 MG PO TABS: 1.0000 | ORAL_TABLET | ORAL | Status: DC | PRN

## 2018-07-18 MED ORDER — ONDANSETRON HCL 4 MG/2ML IJ SOLN: 4.0000 mg | INTRAMUSCULAR | Status: DC | PRN

## 2018-07-18 MED ORDER — ONDANSETRON HCL 4 MG PO TABS: 4.0000 mg | ORAL_TABLET | ORAL | Status: DC | PRN

## 2018-07-18 MED ORDER — EPHEDRINE 5 MG/ML INJ
10.0000 mg | INTRAVENOUS | Status: DC | PRN
  Filled 2018-07-18: qty 2

## 2018-07-18 MED ORDER — OXYTOCIN BOLUS FROM INFUSION
500.0000 mL | Freq: Once | INTRAVENOUS | Status: AC
  Administered 2018-07-18: 500 mL/h via INTRAVENOUS

## 2018-07-18 MED ORDER — ONDANSETRON HCL 4 MG/2ML IJ SOLN: 4.0000 mg | Freq: Four times a day (QID) | INTRAMUSCULAR | Status: DC | PRN

## 2018-07-18 MED ORDER — SENNOSIDES-DOCUSATE SODIUM 8.6-50 MG PO TABS
2.0000 | ORAL_TABLET | ORAL | Status: DC
  Administered 2018-07-18 – 2018-07-19 (×2): 2 via ORAL
  Filled 2018-07-18 (×2): qty 2

## 2018-07-18 MED ORDER — LACTATED RINGERS IV SOLN: 500.0000 mL | INTRAVENOUS | Status: DC | PRN

## 2018-07-18 MED ORDER — ACETAMINOPHEN 325 MG PO TABS
650.0000 mg | ORAL_TABLET | ORAL | Status: DC | PRN
  Administered 2018-07-18 (×2): 650 mg via ORAL
  Filled 2018-07-18 (×2): qty 2

## 2018-07-18 MED ORDER — IBUPROFEN 600 MG PO TABS
600.0000 mg | ORAL_TABLET | Freq: Four times a day (QID) | ORAL | Status: DC
  Administered 2018-07-18 – 2018-07-20 (×10): 600 mg via ORAL
  Filled 2018-07-18 (×10): qty 1

## 2018-07-18 MED ORDER — BENZOCAINE-MENTHOL 20-0.5 % EX AERO
1.0000 "application " | INHALATION_SPRAY | CUTANEOUS | Status: DC | PRN
  Administered 2018-07-18: 1 via TOPICAL
  Filled 2018-07-18: qty 56

## 2018-07-18 MED ORDER — SIMETHICONE 80 MG PO CHEW: 80.0000 mg | CHEWABLE_TABLET | ORAL | Status: DC | PRN

## 2018-07-18 MED ORDER — DIBUCAINE 1 % RE OINT: 1.0000 "application " | TOPICAL_OINTMENT | RECTAL | Status: DC | PRN

## 2018-07-18 MED ORDER — WITCH HAZEL-GLYCERIN EX PADS: 1.0000 "application " | MEDICATED_PAD | CUTANEOUS | Status: DC | PRN

## 2018-07-18 MED ORDER — FENTANYL CITRATE (PF) 100 MCG/2ML IJ SOLN
100.0000 ug | INTRAMUSCULAR | Status: DC | PRN
  Administered 2018-07-18 (×2): 100 ug via INTRAVENOUS
  Filled 2018-07-18: qty 2

## 2018-07-18 MED ORDER — ZOLPIDEM TARTRATE 5 MG PO TABS: 5.0000 mg | ORAL_TABLET | Freq: Every evening | ORAL | Status: DC | PRN

## 2018-07-18 MED ORDER — TETANUS-DIPHTH-ACELL PERTUSSIS 5-2.5-18.5 LF-MCG/0.5 IM SUSP: 0.5000 mL | Freq: Once | INTRAMUSCULAR | Status: DC

## 2018-07-18 MED ORDER — PHENYLEPHRINE 40 MCG/ML (10ML) SYRINGE FOR IV PUSH (FOR BLOOD PRESSURE SUPPORT)
80.0000 ug | PREFILLED_SYRINGE | INTRAVENOUS | Status: DC | PRN
  Filled 2018-07-18: qty 5

## 2018-07-18 MED ORDER — LIDOCAINE HCL (PF) 1 % IJ SOLN
30.0000 mL | INTRAMUSCULAR | Status: DC | PRN
  Filled 2018-07-18: qty 30

## 2018-07-18 MED ORDER — LACTATED RINGERS IV SOLN
500.0000 mL | Freq: Once | INTRAVENOUS | Status: AC
  Administered 2018-07-18: 500 mL via INTRAVENOUS

## 2018-07-18 MED ORDER — FENTANYL 2.5 MCG/ML BUPIVACAINE 1/10 % EPIDURAL INFUSION (WH - ANES)
14.0000 mL/h | INTRAMUSCULAR | Status: DC | PRN
  Filled 2018-07-18: qty 100

## 2018-07-18 MED ORDER — COCONUT OIL OIL: 1.0000 "application " | TOPICAL_OIL | Status: DC | PRN

## 2018-07-18 MED ORDER — FENTANYL CITRATE (PF) 100 MCG/2ML IJ SOLN
INTRAMUSCULAR | Status: AC
  Administered 2018-07-18: 100 ug via INTRAVENOUS
  Filled 2018-07-18: qty 2

## 2018-07-18 MED ORDER — OXYCODONE-ACETAMINOPHEN 5-325 MG PO TABS: 2.0000 | ORAL_TABLET | ORAL | Status: DC | PRN

## 2018-07-18 MED ORDER — OXYTOCIN 40 UNITS IN LACTATED RINGERS INFUSION - SIMPLE MED
2.5000 [IU]/h | INTRAVENOUS | Status: DC
  Filled 2018-07-18: qty 1000

## 2018-07-18 MED ORDER — PHENYLEPHRINE 40 MCG/ML (10ML) SYRINGE FOR IV PUSH (FOR BLOOD PRESSURE SUPPORT)
80.0000 ug | PREFILLED_SYRINGE | INTRAVENOUS | Status: DC | PRN
  Filled 2018-07-18: qty 10
  Filled 2018-07-18: qty 5

## 2018-07-18 MED ORDER — PRENATAL MULTIVITAMIN CH
1.0000 | ORAL_TABLET | Freq: Every day | ORAL | Status: DC
  Administered 2018-07-18 – 2018-07-20 (×3): 1 via ORAL
  Filled 2018-07-18 (×3): qty 1

## 2018-07-18 MED ORDER — DIPHENHYDRAMINE HCL 25 MG PO CAPS: 25.0000 mg | ORAL_CAPSULE | Freq: Four times a day (QID) | ORAL | Status: DC | PRN

## 2018-07-18 MED ORDER — LACTATED RINGERS IV SOLN
INTRAVENOUS | Status: DC
  Administered 2018-07-18: via INTRAVENOUS

## 2018-07-18 MED ORDER — ACETAMINOPHEN 325 MG PO TABS: 650.0000 mg | ORAL_TABLET | ORAL | Status: DC | PRN

## 2018-07-18 MED ORDER — SOD CITRATE-CITRIC ACID 500-334 MG/5ML PO SOLN: 30.0000 mL | ORAL | Status: DC | PRN

## 2018-07-18 NOTE — H&P (Addendum)
OBSTETRIC ADMISSION HISTORY AND PHYSICAL  Brianna Dawson is a 22 y.o. female G2P1001 with IUP at 4347w2d by LMP presenting for labor. She reports +FMs, No LOF, no VB, no blurry vision, headaches or peripheral edema, and RUQ pain.  She plans on breast feeding. She is undecided for birth control. [facility]   Dating: By LMP --->  Estimated Date of Delivery: 07/23/18  Sono:    @[redacted]w[redacted]d , CWD, normal anatomy, cephalic presentation, 533g, 55% EFW   Prenatal History/Complications: -insufficient PNC -fetal cardiac echogenic focus  Past Medical History: Past Medical History:  Diagnosis Date  . Medical history non-contributory     Past Surgical History: Past Surgical History:  Procedure Laterality Date  . NO PAST SURGERIES      Obstetrical History: OB History    Gravida  2   Para  1   Term  1   Preterm  0   AB  0   Living  1     SAB  0   TAB  0   Ectopic  0   Multiple  0   Live Births  1           Social History: Social History   Socioeconomic History  . Marital status: Single    Spouse name: Not on file  . Number of children: Not on file  . Years of education: Not on file  . Highest education level: Not on file  Occupational History  . Not on file  Social Needs  . Financial resource strain: Not on file  . Food insecurity:    Worry: Not on file    Inability: Not on file  . Transportation needs:    Medical: Not on file    Non-medical: Not on file  Tobacco Use  . Smoking status: Former Games developermoker  . Smokeless tobacco: Never Used  Substance and Sexual Activity  . Alcohol use: No  . Drug use: No  . Sexual activity: Yes    Birth control/protection: None  Lifestyle  . Physical activity:    Days per week: Not on file    Minutes per session: Not on file  . Stress: Not on file  Relationships  . Social connections:    Talks on phone: Not on file    Gets together: Not on file    Attends religious service: Not on file    Active  member of club or organization: Not on file    Attends meetings of clubs or organizations: Not on file    Relationship status: Not on file  Other Topics Concern  . Not on file  Social History Narrative  . Not on file    Family History: Family History  Problem Relation Age of Onset  . Hypertension Maternal Grandfather     Allergies: No Known Allergies  Medications Prior to Admission  Medication Sig Dispense Refill Last Dose  . ferrous sulfate 325 (65 FE) MG tablet Take 1 tablet (325 mg total) by mouth daily with breakfast. (Patient not taking: Reported on 08/28/2016) 30 tablet 2 Unknown at Unknown time  . Prenatal Vit-Fe Fumarate-FA (PRENATAL COMPLETE) 14-0.4 MG TABS Take 1 tablet by mouth daily. 30 each 10 Past Week at Unknown time     Review of Systems   All systems reviewed and negative except as stated in HPI  Blood pressure 136/90, pulse 83, temperature 98.2 F (36.8 C), resp. rate 18, height 5\' 5"  (1.651 m), weight 84.8 kg, last menstrual period  10/16/2017, SpO2 100 %, currently breastfeeding. General appearance: alert, cooperative, appears stated age and mild distress Lungs: clear to auscultation bilaterally Heart: regular rate and rhythm Abdomen: soft, non-tender; bowel sounds normal Pelvic: 6/100/+1 Extremities: Homans sign is negative, no sign of DVT Presentation: cephalic Fetal monitoringBaseline: 125 bpm, Variability: Good {> 6 bpm), Accelerations: Reactive and Decelerations: Absent Uterine activityFrequency: Every 2 minutes Dilation: 6 Effacement (%): 100 Station: 0 Exam by:: K.Wilson,RN   Prenatal labs: ABO, Rh: --/--/O POS (09/15 0008) Antibody: NEG (09/15 0008) Rubella: 12.70 (02/19 1640) RPR: Non Reactive (09/10 0900)  HBsAg: Negative (02/19 1640)  HIV: Non Reactive (09/10 0900)  GBS:  neg  2 hr Glucola neg Genetic screening  neg Anatomy US intracardiac echogenic focus  Prenatal Transfer Tool  Maternal Diabetes: No Genetic Screening:  Normal Maternal Ultrasounds/Referrals: Abnormal:  Findings:   Isolated EIF (echogenic intracardiac focus) Fetal Ultrasounds or other Referrals:  Fetal echo Maternal Substance Abuse:  No Significant Maternal Medications:  None Significant Maternal Lab Results: None  Results for orders placed or performed during the hospital encounter of 07/17/18 (from the past 24 hour(s))  POCT fern test   Collection Time: 07/17/18 10:40 PM  Result Value Ref Range   POCT Fern Test Negative = intact amniotic membranes   Fern Test   Collection Time: 07/17/18 11:48 PM  Result Value Ref Range   POCT Fern Test Positive = ruptured amniotic membanes   CBC   Collection Time: 07/18/18 12:08 AM  Result Value Ref Range   WBC 10.3 4.0 - 10.5 K/uL   RBC 4.86 3.87 - 5.11 MIL/uL   Hemoglobin 11.6 (L) 12.0 - 15.0 g/dL   HCT 56.2 13.0 - 86.5 %   MCV 74.9 (L) 78.0 - 100.0 fL   MCH 23.9 (L) 26.0 - 34.0 pg   MCHC 31.9 30.0 - 36.0 g/dL   RDW 78.4 69.6 - 29.5 %   Platelets 167 150 - 400 K/uL  Type and screen [facility]   Collection Time: 07/18/18 12:08 AM  Result Value Ref Range   ABO/RH(D) O POS    Antibody Screen NEG    Sample Expiration      07/21/2018 Performed at [facility], [address] [place], [place] [address]     Patient Active Problem List   Diagnosis Date Noted  . Indication for care in labor or delivery 07/18/2018  . Fetal cardiac echogenic focus 03/24/2018  . [redacted] weeks gestation of pregnancy   . Encounter for fetal anatomic survey   . Supervision of low-risk pregnancy 12/22/2017    Assessment/Plan:  [name] is a 22 y.o. G2P1001 at [redacted]w[redacted]d here for SOL  #Labor:progressing quickly w/o augmentation #Pain: Fentanyl, epidural #FWB: Cat 1 FHT #ID:  GBS neg #MOF: breast #MOC: undecided #Circ:  n/a  Denzil Hughes, MD  07/18/2018, 1:37 AM   Midwife attestation: I have seen and examined this patient; I agree with above documentation in the resident's note.    PE: Gen: calm comfortable, NAD Resp: normal effort and rate Abd: gravid  ROS, labs, PMH reviewed  Assessment/Plan: Taletha Twiford is a 22 y.o. G2P1001 here for labor Admit to LD Labor: active FWB: Cat I ID: [id number] Expectant management  Donette Larry, CNM  07/18/2018, 2:02 AM

## 2018-07-18 NOTE — Lactation Note (Signed)
This note was copied from a baby's chart. Lactation Consultation Note  Patient Name: Brianna Dawson General GNFAO'ZToday's Date: 07/18/2018 Reason for consult: Initial assessment;Term P2, 18 hrs. Female infant. Per mom she doesn't have breast pump at home. LC gave mom a harmony hand pump. Mm plans to exclusively BF so that [facility] due returning work.  Mom's goal is to breastfeed for a longer duration with 2nd baby.  Per mom, BF her eldest daughter 1 month stopped due to returning to school. [facility] [place] and attended BF classes with WIC. Per mom, infant had 3 soiled (meconium) and 3 wet diapers in past 18 hours. Mom demonstrated hand expression and colostrum present both breast. Infant latched on right breast in cross-cradle position, hugging breast in a line position, mouth open wide with wide gape and chin down. Swallows observed by LC.Latch-9 Infant had been latched 12 mins. , and was still breastfeeding as LC left room.  LC discussed I&O. Mom encouraged to feed baby 8-12 times/24 hours and with feeding cues.  LC discussed : LC outpatient clinic, BF support groups, LC hotline and other BF resources within the local community.  Maternal Data Formula Feeding for Exclusion: No Has patient been taught Hand Expression?: Yes(Mom demostrated hand expression to LC colostrum present.) Does the patient have breastfeeding experience prior to this delivery?: Yes  Feeding Feeding Type: Breast Fed Length of feed: 12 min(Mom still BF as LC left room.)  LATCH Score Latch: Grasps breast easily, tongue down, lips flanged, rhythmical sucking.  Audible Swallowing: Spontaneous and intermittent  Type of Nipple: Everted at rest and after stimulation  Comfort (Breast/Nipple): Soft / non-tender  Hold (Positioning): Assistance needed to correctly position infant at breast and maintain latch.  LATCH Score:  9  Interventions Interventions: Breast feeding basics reviewed;Support pillows;Assisted with latch;Breast massage;Skin to skin;Hand express;Breast compression;Adjust position;Hand pump  Lactation Tools Discussed/Used WIC Program: Yes[place] Pump Review: Setup, frequency, and cleaning Initiated by:: Brianna Earthlyobin Brianna Dawson, IBCLC Date initiated:: 07/18/18   Consult Status Consult Status: Follow-up Date: 07/19/18 Follow-up type: In-patient    Brianna EarthlyRobin [name] 07/18/2018, 7:43 PM

## 2018-07-18 NOTE — Progress Notes (Signed)
Pt cannot come to BS now due to acuity in BS. BS [facility]

## 2018-07-19 ENCOUNTER — Encounter (HOSPITAL_COMMUNITY): Payer: Self-pay

## 2018-07-19 NOTE — Progress Notes (Addendum)
POSTPARTUM PROGRESS NOTE  Post Partum Day 1 Subjective:  Brianna Dawson is a 22 y.o. [id number] 6950w2d s/p SVD.  No acute events overnight.  Pt denies problems with ambulating, voiding or po intake.  She denies nausea or vomiting.  Pain is well controlled.  She has had flatus. She has had bowel movement.  Lochia Moderate.   Objective: Blood pressure 124/73, pulse 83, temperature 98.9 F (37.2 C), temperature source Oral, resp. rate 17, height 5\' 5"  (1.651 m), weight 84.8 kg, last menstrual period 10/16/2017, SpO2 100 %, unknown if currently breastfeeding.  Physical Exam:  General: alert, cooperative and no distress Lochia:normal flow Chest: CTAB Heart: RRR no m/r/g Abdomen: +BS, soft, nontender,  Uterine Fundus: firm, below umbilicus DVT Evaluation: No calf swelling or tenderness Extremities: no edema  Recent Labs    07/18/18 0008  HGB 11.6*  HCT 36.4    Assessment/Plan:  ASSESSMENT: Brianna Dawson is a 22 y.o. [id number] 3950w2d s/p SVD  #MOF: breast #MOC: discussed, pt was undecided. Would like to discuss with OB #DC: plan for DC tomorrow   LOS: 1 day   Mirian MoPeter Frank, MD 07/19/2018, 3:52 PM   OB FELLOW POSTPARTUM PROGRESS NOTE ATTESTATION  I have seen and examined this patient and agree with above documentation in the resident's note.   Marcy Sirenatherine Wray Goehring, D.O. OB Fellow  07/19/2018, 4:13 PM

## 2018-07-19 NOTE — Lactation Note (Signed)
This note was copied from a baby's chart. Lactation Consultation Note  Patient Name: Brianna Dawson Today's Date: 07/19/2018   MD request assessment of baby's latch and feeding at the breast. Mom sleeping, baby sleeping. Wrote phone # on dry erase board, and asked FOB to call when baby is getting ready for next feeding.  Brianna Dawson, Brianna Dawson 07/19/2018, 10:26 AM

## 2018-07-19 NOTE — Lactation Note (Signed)
This note was copied from a baby's chart. Lactation Consultation Note  Patient Name: Brianna Dawson ZOXWR'UToday's Date: 07/19/2018 Reason for consult: Follow-up assessment;Infant weight loss;Term  Visited with P2 Mom on day of possible discharge.  Baby at 7.8% weight loss.  Baby having 2 voids and 4 stools last 24 hrs. Mom started to latch baby without using pillow support, or head support by bringing breast to baby.  Baby dressed and swaddled in blanket. Offered to assist with positioning and latching. Added pillow support, and support to Mom's back.  Unwrapped and undressed baby.  Taught Mom how to use cross cradle hold, with explanation on importance of controlling baby's latch onto breast, and supporting her breast in a U hold.   Baby fed rhythmically with swallowing identified for 15 mins when in room.  Basics reviewed.  Plan- 1- Keep baby STS as much as possible 2- Offer the breast when baby shows feeding cues. 3- Offer both breasts at each feeding. 4- Ask for help prn.  Mom aware of OP lactation support available to her. Feeding Feeding Type: Breast Fed  LATCH Score Latch: Grasps breast easily, tongue down, lips flanged, rhythmical sucking.  Audible Swallowing: Spontaneous and intermittent  Type of Nipple: Everted at rest and after stimulation  Comfort (Breast/Nipple): Soft / non-tender  Hold (Positioning): Assistance needed to correctly position infant at breast and maintain latch.  LATCH Score: 9  Interventions Interventions: Breast feeding basics reviewed;Assisted with latch;Skin to skin;Breast massage;Hand express;Breast compression;Adjust position;Support pillows;Position options;Hand pump  Lactation Tools Discussed/Used Tools: Pump Breast pump type: Manual   Consult Status Consult Status: Follow-up Date: 07/20/18 Follow-up type: In-patient    Judee ClaraSmith, Wells Mabe E 07/19/2018, 12:11 PM

## 2018-07-20 NOTE — Discharge Summary (Signed)
OB Discharge Summary     Patient Name: Brianna Dawson DOB: 31-Aug-1996 MRN: [id number]  Date of admission: 07/17/2018 Delivering MD: Lorenza BurtonASH, KELSEY D   Date of discharge: 07/20/2018  Admitting diagnosis: 39WKS CTX EVERY 3 MIN Intrauterine pregnancy: 5713w2d     Secondary diagnosis:  Active Problems:   Indication for care in labor or delivery   SVD (spontaneous vaginal delivery)  Additional problems: Anemia during pregnancy     Discharge diagnosis: Term Pregnancy Delivered and Anemia                                                                                                Post partum procedures:none  Augmentation: none  Complications: None  Hospital course:  Onset of Labor With Vaginal Delivery     22 y.o. yo [id number] at 8113w2d was admitted in Active Labor on 07/17/2018. Patient had an uncomplicated labor course as follows:  Membrane Rupture Time/Date: 11:30 PM ,07/17/2018   Intrapartum Procedures: Episiotomy: None [1]                                         Lacerations:  None [1]  Patient had a delivery of a Viable infant. 07/18/2018  Information for the patient's newborn:  Truett MainlandHarris, Girl Ania [[id number]][[id number]]  Delivery Method: Vag-Spont    Pateint had an uncomplicated postpartum course.  She is ambulating, tolerating a regular diet, passing flatus, and urinating well. Patient is discharged home in stable condition on 07/20/18.   Physical exam  Vitals:   07/19/18 0525 07/19/18 1409 07/19/18 2231 07/20/18 0610  BP: 111/62 124/73 125/60 117/71  Pulse: 81 83 86 83  Resp: 18 17 16 14   Temp: 98.3 F (36.8 C) 98.9 F (37.2 C) 98.2 F (36.8 C) 98.1 F (36.7 C)  TempSrc: Oral Oral Oral Oral  SpO2: 100% 100% 100% 100%  Weight:      Height:       General: alert, cooperative and no distress Lochia: appropriate Uterine Fundus: firm Incision: N/A DVT Evaluation: No evidence of DVT seen on physical exam. No cords or calf tenderness. No significant calf/ankle edema. Labs: Lab  Results  Component Value Date   WBC 10.3 07/18/2018   HGB 11.6 (L) 07/18/2018   HCT 36.4 07/18/2018   MCV 74.9 (L) 07/18/2018   PLT 167 07/18/2018   CMP Latest Ref Rng & Units 11/28/2017  Glucose 65 - 99 mg/dL 86  BUN 6 - 20 mg/dL 11  Creatinine 1.300.44 - 8.651.00 mg/dL 7.840.60  Sodium 696135 - 295145 mmol/L 134(L)  Potassium 3.5 - 5.1 mmol/L 3.8  Chloride 101 - 111 mmol/L 102  CO2 22 - 32 mmol/L 25  Calcium 8.9 - 10.3 mg/dL 9.1  Total Protein 6.5 - 8.1 g/dL 7.1  Total Bilirubin 0.3 - 1.2 mg/dL 0.6  Alkaline Phos 38 - 126 U/L 51  AST 15 - 41 U/L 15  ALT 14 - 54 U/L 11(L)    Discharge instruction: per After Visit Summary and "Baby and Me Booklet".  After visit meds:  Allergies as of 07/20/2018   No Known Allergies     Medication List    TAKE these medications   ferrous sulfate 325 (65 FE) MG tablet Take 1 tablet (325 mg total) by mouth daily with breakfast.   PRENATAL COMPLETE 14-0.4 MG Tabs Take 1 tablet by mouth daily.       Diet: routine diet  Activity: Advance as tolerated. Pelvic rest for 6 weeks.   Outpatient follow up:4 weeks Follow up Appt: Future Appointments  Date Time Provider Department Center  08/30/2018 10:35 AM Gwenevere Abbot, MD [facility]   Follow up Visit:No follow-ups on file.  Postpartum contraception: IUD or Depo  Newborn Data: Live born female  Birth Weight: 6 lb 12.5 oz (3075 g) APGAR: 8, 9  Newborn Delivery   Time head delivered:  07/18/2018 01:28:00 Birth date/time:  07/18/2018 01:28:00 Delivery type:  Vaginal, Spontaneous     Baby Feeding: Breast Disposition:home with mother   07/20/2018 Sharyon Cable, CNM

## 2018-07-20 NOTE — Lactation Note (Signed)
This note was copied from a baby's chart. Lactation Consultation Note  Patient Name: Girl Claudia Pollockierra Torok ZOXWR'UToday's Date: 07/20/2018 Reason for consult: Follow-up assessment  Visited with P2 Mom on day of discharge, baby 2555 hrs old.  Baby at 6.8% weight loss which is an increase of 30 gm from yesterday. Mom fed baby exclusively at breast. Baby breastfed 16 times last 24 hrs.  Mom denies any nipple pain.  Breasts are filling today, but not engorged.   Mom feels much better about going home. Encouraged to continue STS, feeding on cue. Mom aware of engorgement prevention and treatment.  Mom aware of OP lactation support available and encouraged to call prn   Consult Status Consult Status: Complete Date: 07/20/18 Follow-up type: Call as needed    Judee ClaraSmith, Saren Corkern E 07/20/2018, 9:01 AM

## 2018-07-22 ENCOUNTER — Encounter: Payer: Self-pay | Admitting: Student

## 2018-08-30 ENCOUNTER — Ambulatory Visit: Payer: Self-pay | Admitting: Family Medicine

## 2018-11-15 ENCOUNTER — Encounter: Payer: Self-pay | Admitting: Family Medicine

## 2019-07-20 ENCOUNTER — Other Ambulatory Visit: Payer: Self-pay | Admitting: Cardiology

## 2019-07-20 DIAGNOSIS — Z20822 Contact with and (suspected) exposure to covid-19: Secondary | ICD-10-CM

## 2019-07-21 LAB — NOVEL CORONAVIRUS, NAA: SARS-CoV-2, NAA: NOT DETECTED

## 2019-11-04 NOTE — L&D Delivery Note (Signed)
[facility]N [facility] Delivery Note  Brianna Dawson is a 24 y.o. [id number] s/p induced vaginal at [redacted]w[redacted]d for FGR/Dopplers.   GBS Status: NEGATIVE/-- (10/01 0919) Maximum Maternal Temperature: 99.58F  Labor Progress:  Admitted for IOL  S/p FB and cytotec  SROM 0h 38m prior to delivery with clear fluid   Dilation complete   Delivery Date/Time: 08/03/2020 at 2159 Delivery: Called to room and patient was complete and pushing. Head delivered ROA.  No nuchal cord present. . Shoulder and body delivered in usual fashion. Infant with spontaneous cry, placed skin to skin on mother's abdomen, dried and stimulated. Cord clamped x 2 after 1-minute delay, and cut by delivering provider. Cord blood drawn. Placenta delivered spontaneously with gentle cord traction. Fundus firm with massage and Pitocin. Labia, perineum, vagina, and cervix inspected with periurethral that did not require repair as the tissue was hemostatic and approximated well. .   Placenta: spontaneous , intact  Complications: none EBL: 200 mL  Analgesia: Epidural anesthesia  Postpartum Planning Mom and baby to mother/baby.   Lactation consult  AM CBC  Contraception ppIUD placed    Circ N/A    Infant: Viable baby girl   APGAR: 8&9   wt: see EMR   Genia Hotter, M.D.  08/03/2020 10:28 PM

## 2019-11-08 ENCOUNTER — Ambulatory Visit
Admission: EM | Admit: 2019-11-08 | Discharge: 2019-11-08 | Discharge status: Absent without leave | Payer: Medicaid Other

## 2019-11-10 ENCOUNTER — Other Ambulatory Visit: Payer: Self-pay

## 2019-11-10 ENCOUNTER — Encounter: Payer: Self-pay | Admitting: Emergency Medicine

## 2019-11-10 ENCOUNTER — Ambulatory Visit
Admission: EM | Admit: 2019-11-10 | Discharge: 2019-11-10 | Disposition: A | Discharge status: Home / Self Care | Payer: Medicaid Other | Attending: Emergency Medicine | Admitting: Emergency Medicine

## 2019-11-10 DIAGNOSIS — R059 Cough, unspecified: Secondary | ICD-10-CM

## 2019-11-10 DIAGNOSIS — Z20822 Contact with and (suspected) exposure to covid-19: Secondary | ICD-10-CM

## 2019-11-10 DIAGNOSIS — R05 Cough: Secondary | ICD-10-CM

## 2019-11-10 MED ORDER — AEROCHAMBER PLUS FLO-VU MEDIUM MISC: 1.0000 | Freq: Once | 0 refills | Status: AC

## 2019-11-10 MED ORDER — BENZONATATE 100 MG PO CAPS: 100.0000 mg | ORAL_CAPSULE | Freq: Three times a day (TID) | ORAL | 0 refills | Status: DC

## 2019-11-10 MED ORDER — ALBUTEROL SULFATE HFA 108 (90 BASE) MCG/ACT IN AERS: 2.0000 | INHALATION_SPRAY | RESPIRATORY_TRACT | 0 refills | Status: DC | PRN

## 2019-11-10 NOTE — Discharge Instructions (Addendum)
Your COVID test is pending - it is important to quarantine / isolate at home until your results are back. °If you test positive and would like further evaluation for persistent or worsening symptoms, you may schedule an E-visit or virtual (video) visit throughout the [facility] MyChart app or website. ° °PLEASE NOTE: If you develop severe chest pain or shortness of breath please go to the ER or call 9-1-1 for further evaluation --> DO NOT schedule electronic or virtual visits for this. °Please call our office for further guidance / recommendations as needed. °

## 2019-11-10 NOTE — ED Triage Notes (Signed)
PT presents after daughter tested positive for COVID.  Pt c/o headache for 4-5 days, and cough x 2.  C/o post-nasal drip.  Denis n/v/d.

## 2019-11-10 NOTE — ED Provider Notes (Signed)
[name]    CSN: [id number] Arrival date & time: 11/10/19  1603      History   Chief Complaint Chief Complaint  Patient presents with  . APPT: 4:00pm  . COVID-Like Symptoms    HPI Brianna Dawson is a 24 y.o. female   Presenting for Covid testing: Exposure: Daughter Date of exposure: cohab[place] Any fever, symptoms since exposure: yes -generalized headache for last 4 to 5 days, nonproductive cough for the last 2.  No difficulty breathing, chest pain, fever.  Has not tried nothing for symptoms.   Past Medical History:  Diagnosis Date  . Medical history non-contributory     Patient Active Problem List   Diagnosis Date Noted  . Indication for care in labor or delivery 07/18/2018  . SVD (spontaneous vaginal delivery) 07/18/2018  . Fetal cardiac echogenic focus 03/24/2018  . [redacted] weeks gestation of pregnancy   . Encounter for fetal anatomic survey   . Supervision of low-risk pregnancy 12/22/2017    Past Surgical History:  Procedure Laterality Date  . NO PAST SURGERIES      OB History    Gravida  2   Para  2   Term  2   Preterm  0   AB  0   Living  2     SAB  0   TAB  0   Ectopic  0   Multiple  0   Live Births  2            Home Medications    Prior to Admission medications   Medication Sig Start Date End Date Taking? Authorizing Provider  albuterol (VENTOLIN HFA) 108 (90 Base) MCG/ACT inhaler Inhale 2 puffs into the lungs every 4 (four) hours as needed for wheezing or shortness of breath. 11/10/19   Hall-Potvin, [place], PA-C  benzonatate (TESSALON) 100 MG capsule Take 1 capsule (100 mg total) by mouth every 8 (eight) hours. 11/10/19   Hall-Potvin, [place], PA-C  ferrous sulfate 325 (65 FE) MG tablet Take 1 tablet (325 mg total) by mouth daily with breakfast. Patient not taking: Reported on 08/28/2016 07/06/13   Aviva Signs, CNM  Prenatal Vit-Fe Fumarate-FA (PRENATAL COMPLETE) 14-0.4 MG TABS Take 1 tablet by mouth daily. 03/08/18    Leftwich-Kirby, Wilmer Floor, CNM  Spacer/Aero-Holding Chambers (AEROCHAMBER PLUS FLO-VU MEDIUM) MISC 1 each by Other route once for 1 dose. 11/10/19 11/10/19  Hall-Potvin, [place], PA-C    Family History Family History  Problem Relation Age of Onset  . Hypertension Maternal Grandfather     Social History Social History   Tobacco Use  . Smoking status: Former Games developer  . Smokeless tobacco: Never Used  Substance Use Topics  . Alcohol use: No  . Drug use: No     Allergies   Patient has no known allergies.   Review of Systems Review of Systems  Constitutional: Negative for activity change, appetite change, fatigue and fever.  HENT: Negative for congestion, ear pain, sinus pain, sore throat and voice change.   Eyes: Negative for pain, redness and visual disturbance.  Respiratory: Positive for cough. Negative for chest tightness, shortness of breath, wheezing and stridor.   Cardiovascular: Positive for chest pain. Negative for palpitations and leg swelling.  Gastrointestinal: Negative for abdominal pain, diarrhea and vomiting.  Musculoskeletal: Negative for arthralgias and myalgias.  Skin: Negative for rash and wound.  Neurological: Positive for headaches. Negative for dizziness, syncope, weakness and light-headedness.     Physical Exam Triage Vital Signs ED  Triage Vitals [11/10/19 1624]  Enc Vitals Group     BP 110/73     Pulse Rate 91     Resp 16     Temp 97.8 F (36.6 C)     Temp Source Temporal     SpO2 97 %     Weight      Height      Head Circumference      Peak Flow      Pain Score 0     Pain Loc      Pain Edu?      Excl. in GC?    No data found.  Updated Vital Signs BP 110/73 (BP Location: Left Arm)   Pulse 91   Temp 97.8 F (36.6 C) (Temporal)   Resp 16   LMP 10/18/2019   SpO2 97%   Visual Acuity Right Eye Distance:   Left Eye Distance:   Bilateral Distance:    Right Eye Near:   Left Eye Near:    Bilateral Near:     Physical  Exam Constitutional:      General: She is not in acute distress.    Appearance: She is obese. She is not ill-appearing.  HENT:     Head: Normocephalic and atraumatic.     Mouth/Throat:     Mouth: Mucous membranes are moist.     Pharynx: Oropharynx is clear.  Eyes:     General: No scleral icterus.    Pupils: Pupils are equal, round, and reactive to light.  Cardiovascular:     Rate and Rhythm: Normal rate and regular rhythm.  Pulmonary:     Effort: Pulmonary effort is normal. No respiratory distress.     Breath sounds: Rhonchi present. No wheezing or rales.     Comments: Rhonchi improves with cough Skin:    Capillary Refill: Capillary refill takes less than 2 seconds.     Coloration: Skin is not jaundiced or pale.     Findings: No rash.  Neurological:     General: No focal deficit present.     Mental Status: She is alert and oriented to person, place, and time.      UC Treatments / Results  Labs (all labs ordered are listed, but only abnormal results are displayed) Labs Reviewed  NOVEL CORONAVIRUS, NAA    EKG   Radiology No results found.  Procedures Procedures (including critical care time)  Medications Ordered in UC Medications - No data to display  Initial Impression / Assessment and Plan / UC Course  I have reviewed the triage vital signs and the nursing notes.  Pertinent labs & imaging results that were available during my care of the patient were reviewed by me and considered in my medical decision making (see chart for details).     Patient afebrile, nontoxic, with SpO2 97%.  Covid PCR pending.  Patient to quarantine until results are back.  We will treat supportively as below.  Return precautions discussed, patient verbalized understanding and is agreeable to plan. Final Clinical Impressions(s) / UC Diagnoses   Final diagnoses:  Cough  Exposure to COVID-19 virus     Discharge Instructions     Your COVID test is pending - it is important to  quarantine / isolate at home until your results are back. If you test positive and would like further evaluation for persistent or worsening symptoms, you may schedule an E-visit or virtual (video) visit throughout the [facility] app or website.  PLEASE NOTE: If  you develop severe chest pain or shortness of breath please go to the ER or call 9-1-1 for further evaluation --> DO NOT schedule electronic or virtual visits for this. Please call our office for further guidance / recommendations as needed.    ED Prescriptions    Medication Sig Dispense Auth. Provider   albuterol (VENTOLIN HFA) 108 (90 Base) MCG/ACT inhaler Inhale 2 puffs into the lungs every 4 (four) hours as needed for wheezing or shortness of breath. 18 g Hall-Potvin, [place], PA-C   Spacer/Aero-Holding Chambers (AEROCHAMBER PLUS FLO-VU MEDIUM) MISC 1 each by Other route once for 1 dose. 1 each Hall-Potvin, [place], PA-C   benzonatate (TESSALON) 100 MG capsule Take 1 capsule (100 mg total) by mouth every 8 (eight) hours. 21 capsule Hall-Potvin, [place], PA-C     PDMP not reviewed this encounter.   Hall-Potvin, [place], [place] 11/10/19 1651

## 2019-11-12 LAB — NOVEL CORONAVIRUS, NAA: SARS-CoV-2, NAA: NOT DETECTED

## 2019-12-18 ENCOUNTER — Encounter: Payer: Self-pay | Admitting: Emergency Medicine

## 2019-12-18 ENCOUNTER — Other Ambulatory Visit: Payer: Self-pay

## 2019-12-18 ENCOUNTER — Ambulatory Visit
Admission: EM | Admit: 2019-12-18 | Discharge: 2019-12-18 | Disposition: A | Discharge status: Home / Self Care | Payer: Medicaid Other | Attending: Emergency Medicine | Admitting: Emergency Medicine

## 2019-12-18 DIAGNOSIS — J029 Acute pharyngitis, unspecified: Secondary | ICD-10-CM | POA: Diagnosis not present

## 2019-12-18 DIAGNOSIS — K122 Cellulitis and abscess of mouth: Secondary | ICD-10-CM | POA: Insufficient documentation

## 2019-12-18 DIAGNOSIS — O99013 Anemia complicating pregnancy, third trimester: Secondary | ICD-10-CM | POA: Diagnosis present

## 2019-12-18 DIAGNOSIS — Z3201 Encounter for pregnancy test, result positive: Secondary | ICD-10-CM | POA: Diagnosis not present

## 2019-12-18 DIAGNOSIS — Z20822 Contact with and (suspected) exposure to covid-19: Secondary | ICD-10-CM

## 2019-12-18 DIAGNOSIS — R062 Wheezing: Secondary | ICD-10-CM

## 2019-12-18 DIAGNOSIS — Z3A01 Less than 8 weeks gestation of pregnancy: Secondary | ICD-10-CM | POA: Insufficient documentation

## 2019-12-18 LAB — POCT URINE PREGNANCY: Preg Test, Ur: POSITIVE — AB

## 2019-12-18 LAB — POCT RAPID STREP A (OFFICE): Rapid Strep A Screen: NEGATIVE

## 2019-12-18 MED ORDER — ALBUTEROL SULFATE HFA 108 (90 BASE) MCG/ACT IN AERS: 2.0000 | INHALATION_SPRAY | RESPIRATORY_TRACT | 0 refills | Status: DC | PRN

## 2019-12-18 MED ORDER — FERROUS SULFATE 325 (65 FE) MG PO TABS: 325.0000 mg | ORAL_TABLET | Freq: Every day | ORAL | 2 refills | Status: DC

## 2019-12-18 MED ORDER — AEROCHAMBER PLUS FLO-VU MEDIUM MISC: 1.0000 | Freq: Once | 0 refills | Status: AC

## 2019-12-18 MED ORDER — PRENATAL COMPLETE 14-0.4 MG PO TABS: 1.0000 | ORAL_TABLET | Freq: Every day | ORAL | 0 refills | Status: AC

## 2019-12-18 NOTE — ED Notes (Signed)
Patient able to ambulate independently  

## 2019-12-18 NOTE — ED Provider Notes (Signed)
[name]    CSN: [id number] Arrival date & time: 12/18/19  8299      History   Chief Complaint Chief Complaint  Patient presents with  . Sore Throat    HPI Brianna Dawson is a 24 y.o. female for sore throat since this morning.  States she feels that her uvula is swollen, hanging on her tongue.  Denies drooling, choking, difficulty breathing.  States she has a mild cough that is nonproductive, worsened when she smokes more black in miles.  No fever, arthralgias, myalgias, chest pain, nausea, vomiting or abdominal pain.  Has not taken anything for symptoms.  No known sick exposures.  LMP 11/18/2019: Currently sexually active, not taking OCPs/using condoms regularly.  Past Medical History:  Diagnosis Date  . Medical history non-contributory     Patient Active Problem List   Diagnosis Date Noted  . Indication for care in labor or delivery 07/18/2018  . SVD (spontaneous vaginal delivery) 07/18/2018  . Fetal cardiac echogenic focus 03/24/2018  . [redacted] weeks gestation of pregnancy   . Encounter for fetal anatomic survey   . Supervision of low-risk pregnancy 12/22/2017    Past Surgical History:  Procedure Laterality Date  . NO PAST SURGERIES      OB History    Gravida  2   Para  2   Term  2   Preterm  0   AB  0   Living  2     SAB  0   TAB  0   Ectopic  0   Multiple  0   Live Births  2            Home Medications    Prior to Admission medications   Medication Sig Start Date End Date Taking? Authorizing Provider  albuterol (VENTOLIN HFA) 108 (90 Base) MCG/ACT inhaler Inhale 2 puffs into the lungs every 4 (four) hours as needed for wheezing or shortness of breath. 11/10/19   Hall-Potvin, [place], PA-C  ferrous sulfate 325 (65 FE) MG tablet Take 1 tablet (325 mg total) by mouth daily with breakfast. Patient not taking: Reported on 08/28/2016 07/06/13   Aviva Signs, CNM  Prenatal Vit-Fe Fumarate-FA (PRENATAL COMPLETE) 14-0.4 MG TABS Take 1  tablet by mouth daily. 03/08/18   Leftwich-Kirby, Wilmer Floor, CNM    Family History Family History  Problem Relation Age of Onset  . Hypertension Maternal Grandfather     Social History Social History   Tobacco Use  . Smoking status: Former Games developer  . Smokeless tobacco: Never Used  Substance Use Topics  . Alcohol use: No  . Drug use: No     Allergies   Patient has no known allergies.   Review of Systems As per HPI   Physical Exam Triage Vital Signs ED Triage Vitals  Enc Vitals Group     BP      Pulse      Resp      Temp      Temp src      SpO2      Weight      Height      Head Circumference      Peak Flow      Pain Score      Pain Loc      Pain Edu?      Excl. in GC?    No data found.  Updated Vital Signs BP 116/74 (BP Location: Left Arm)   Pulse 79   Temp  98.1 F (36.7 C) (Temporal)   Resp 16   LMP 11/18/2019   SpO2 96%   Visual Acuity Right Eye Distance:   Left Eye Distance:   Bilateral Distance:    Right Eye Near:   Left Eye Near:    Bilateral Near:     Physical Exam Constitutional:      General: She is not in acute distress.    Appearance: She is not ill-appearing.  HENT:     Head: Normocephalic and atraumatic.     Jaw: There is normal jaw occlusion. No tenderness or pain on movement.     Right Ear: Hearing, tympanic membrane, ear canal and external ear normal. No tenderness. No mastoid tenderness.     Left Ear: Hearing, tympanic membrane, ear canal and external ear normal. No tenderness. No mastoid tenderness.     Nose: No nasal deformity, septal deviation or nasal tenderness.     Right Turbinates: Not swollen or pale.     Left Turbinates: Not swollen or pale.     Right Sinus: No maxillary sinus tenderness or frontal sinus tenderness.     Left Sinus: No maxillary sinus tenderness or frontal sinus tenderness.     Mouth/Throat:     Lips: Pink. No lesions.     Mouth: Mucous membranes are moist. No injury.     Pharynx: Oropharynx is  clear. Uvula midline. No posterior oropharyngeal erythema or uvula swelling.     Tonsils: No tonsillar exudate or tonsillar abscesses. 2+ on the right. 2+ on the left.     Comments: Reveal a mildly edematous and erythematous, the midline and raises symmetrically. Eyes:     Conjunctiva/sclera: Conjunctivae normal.     Pupils: Pupils are equal, round, and reactive to light.  Cardiovascular:     Rate and Rhythm: Normal rate and regular rhythm.     Heart sounds: No murmur. No gallop.   Pulmonary:     Effort: Pulmonary effort is normal. No respiratory distress.     Breath sounds: No stridor. Wheezing present. No rhonchi or rales.  Musculoskeletal:     Cervical back: Normal range of motion and neck supple. No muscular tenderness.  Lymphadenopathy:     Cervical: No cervical adenopathy.  Skin:    General: Skin is warm.     Capillary Refill: Capillary refill takes less than 2 seconds.     Coloration: Skin is not pale.     Findings: No rash.  Neurological:     Mental Status: She is alert and oriented to person, place, and time.      UC Treatments / Results  Labs (all labs ordered are listed, but only abnormal results are displayed) Labs Reviewed  POCT RAPID STREP A (OFFICE) - Normal  NOVEL CORONAVIRUS, NAA  CULTURE, GROUP A STREP [facility])  POCT URINE PREGNANCY    EKG   Radiology No results found.  Procedures Procedures (including critical care time)  Medications Ordered in UC Medications - No data to display  Initial Impression / Assessment and Plan / UC Course  I have reviewed the triage vital signs and the nursing notes.  Pertinent labs & imaging results that were available during my care of the patient were reviewed by me and considered in my medical decision making (see chart for details).     Patient afebrile, nontoxic, with SpO2 96%.  Covid PCR pending.  Patient to quarantine until results are back.  Strep negative, culture pending.  Urine pregnancy positive.   Patient has had  subtle cough yesterday, none today, though does have wheezing on exam without signs of respiratory distress: Refill for albuterol sent.  Discussed supportive management as outlined below.  Return precautions discussed, patient verbalized understanding and is agreeable to plan. Final Clinical Impressions(s) / UC Diagnoses   Final diagnoses:  Sore throat  Uvulitis     Discharge Instructions     Your rapid strep test was negative today.  The culture is pending.  Please look on your MyChart for test results.   We will notify you if the culture positive and outline a treatment plan at that time.   Please continue Tylenol and/or Ibuprofen as needed for fever, pain.  May try warm salt water gargles, cepacol lozenges, throat spray, warm tea or water with lemon/honey, or OTC cold relief medicine for throat discomfort.     ED Prescriptions    None     PDMP not reviewed this encounter.   Hall-Potvin, [place], [place] 12/18/19 1017

## 2019-12-18 NOTE — ED Triage Notes (Signed)
Pt presents to [facility] for assessment after waking up to a mildly sore throat and swollen uvula.  Redness and swelling noted to throat in triage.  Denies drooling, choking on her spit.

## 2019-12-18 NOTE — Discharge Instructions (Addendum)
Your rapid strep test was negative today.  The culture is pending.  Please look on your MyChart for test results.   We will notify you if the culture positive and outline a treatment plan at that time.   Please continue Tylenol and/or Ibuprofen as needed for fever, pain.  May try warm salt water gargles, cepacol lozenges, throat spray, warm tea or water with lemon/honey, or OTC cold relief medicine for throat discomfort.  

## 2019-12-19 LAB — NOVEL CORONAVIRUS, NAA: SARS-CoV-2, NAA: NOT DETECTED

## 2019-12-21 LAB — CULTURE, GROUP A STREP (THRC)

## 2019-12-31 ENCOUNTER — Ambulatory Visit
Admission: EM | Admit: 2019-12-31 | Discharge: 2019-12-31 | Disposition: A | Discharge status: Home / Self Care | Payer: Medicaid Other | Attending: Emergency Medicine | Admitting: Emergency Medicine

## 2019-12-31 ENCOUNTER — Other Ambulatory Visit: Payer: Self-pay

## 2019-12-31 DIAGNOSIS — L02214 Cutaneous abscess of groin: Secondary | ICD-10-CM

## 2019-12-31 MED ORDER — CEPHALEXIN 500 MG PO CAPS: 500.0000 mg | ORAL_CAPSULE | Freq: Four times a day (QID) | ORAL | 0 refills | Status: DC

## 2019-12-31 MED ORDER — CEPHALEXIN 500 MG PO CAPS: 500.0000 mg | ORAL_CAPSULE | Freq: Four times a day (QID) | ORAL | 0 refills | Status: AC

## 2019-12-31 NOTE — ED Provider Notes (Signed)
[name]    CSN: [id number] Arrival date & time: 12/31/19  0827      History   Chief Complaint Chief Complaint  Patient presents with  . Abscess    HPI Brianna Dawson is a 24 y.o. female with history of obesity presenting for possible abscess.  States she noticed pain 3 to 4 days ago and has had increased swelling since.  Has tried popping it without relief.  Patient last seen by me 12/18/2019: Found to be pregnant-states she still is, though not currently taking prenatal vitamin.  Denies fever, chills, arthralgias, myalgias, open wound or active discharge.    Past Medical History:  Diagnosis Date  . Medical history non-contributory     Patient Active Problem List   Diagnosis Date Noted  . Indication for care in labor or delivery 07/18/2018  . SVD (spontaneous vaginal delivery) 07/18/2018  . Fetal cardiac echogenic focus 03/24/2018  . [redacted] weeks gestation of pregnancy   . Encounter for fetal anatomic survey   . Supervision of low-risk pregnancy 12/22/2017    Past Surgical History:  Procedure Laterality Date  . NO PAST SURGERIES      OB History    Gravida  3   Para  2   Term  2   Preterm  0   AB  0   Living  2     SAB  0   TAB  0   Ectopic  0   Multiple  0   Live Births  2            Home Medications    Prior to Admission medications   Medication Sig Start Date End Date Taking? Authorizing Provider  albuterol (VENTOLIN HFA) 108 (90 Base) MCG/ACT inhaler Inhale 2 puffs into the lungs every 4 (four) hours as needed for wheezing or shortness of breath. 12/18/19   Hall-Potvin, [place], PA-C  cephALEXin (KEFLEX) 500 MG capsule Take 1 capsule (500 mg total) by mouth 4 (four) times daily for 5 days. 12/31/19 01/05/20  Hall-Potvin, [place], PA-C  Prenatal Vit-Fe Fumarate-FA (PRENATAL COMPLETE) 14-0.4 MG TABS Take 1 tablet by mouth daily. 12/18/19 01/17/20  Hall-Potvin, [place], PA-C    Family History Family History  Problem Relation  Age of Onset  . Hypertension Maternal Grandfather     Social History Social History   Tobacco Use  . Smoking status: Light Tobacco Smoker    Types: Cigars  . Smokeless tobacco: Never Used  Substance Use Topics  . Alcohol use: No  . Drug use: No     Allergies   Patient has no known allergies.   Review of Systems As per HPI   Physical Exam Triage Vital Signs ED Triage Vitals  Enc Vitals Group     BP      Pulse      Resp      Temp      Temp src      SpO2      Weight      Height      Head Circumference      Peak Flow      Pain Score      Pain Loc      Pain Edu?      Excl. in [place]?    No data found.  Updated Vital Signs BP 118/76   Pulse (!) 105   Temp 98.1 F (36.7 C) (Oral)   Resp 18   LMP 11/18/2019   SpO2 96%  Visual Acuity Right Eye Distance:   Left Eye Distance:   Bilateral Distance:    Right Eye Near:   Left Eye Near:    Bilateral Near:     Physical Exam Constitutional:      General: She is not in acute distress.    Appearance: She is obese. She is not ill-appearing.  HENT:     Head: Normocephalic and atraumatic.  Eyes:     General: No scleral icterus.    Pupils: Pupils are equal, round, and reactive to light.  Cardiovascular:     Rate and Rhythm: Tachycardia present.     Heart sounds: No murmur. No gallop.   Pulmonary:     Effort: Pulmonary effort is normal. No respiratory distress.     Breath sounds: No wheezing.  Genitourinary:    General: Normal vulva.     Comments: 3 cm area of induration and TTP without overlying erythema on left groin.  Spares labia.  No inguinal lymphadenopathy.  No open wound or active discharge. Skin:    Capillary Refill: Capillary refill takes less than 2 seconds.     Coloration: Skin is not jaundiced or pale.     Findings: No bruising.  Neurological:     Mental Status: She is alert and oriented to person, place, and time.      UC Treatments / Results  Labs (all labs ordered are listed, but only  abnormal results are displayed) Labs Reviewed - No data to display  EKG   Radiology No results found.  Procedures Procedures (including critical care time)  Medications Ordered in UC Medications - No data to display  Initial Impression / Assessment and Plan / UC Course  I have reviewed the triage vital signs and the nursing notes.  Pertinent labs & imaging results that were available during my care of the patient were reviewed by me and considered in my medical decision making (see chart for details).     Patient afebrile, nontoxic in office today.  H&P concerning for forming abscess.  No area of fluctuance, indurated and tender.  Discussed possible I&D versus needle aspiration and risks thereof.  Will defer creating open wound at patient request: Start Keflex 4 times daily as outlined below.  Will be more aggressive with hot compresses/sitz bath.  Return precautions discussed, patient verbalized understanding and is agreeable to plan. Final Clinical Impressions(s) / UC Diagnoses   Final diagnoses:  Abscess of left groin     Discharge Instructions     Keep area(s) clean and dry. Apply hot compress / towel for 5-10 minutes 3-5 times daily. Take antibiotic as prescribed with food - important to complete course. Return for worsening pain, redness, swelling, discharge, fever.  Helpful prevention tips: Keep nails short to avoid secondary skin infections. Use new, clean razors when shaving. Avoid antiperspirants - look for deodorants without aluminum. Avoid wearing underwire bras as this can irritate the area further.     ED Prescriptions    Medication Sig Dispense Auth. Provider   cephALEXin (KEFLEX) 500 MG capsule  (Status: Discontinued) Take 1 capsule (500 mg total) by mouth 4 (four) times daily for 5 days. 20 capsule Hall-Potvin, [place], PA-C   cephALEXin (KEFLEX) 500 MG capsule Take 1 capsule (500 mg total) by mouth 4 (four) times daily for 5 days. 20 capsule  Hall-Potvin, [place], PA-C     PDMP not reviewed this encounter.   Hall-Potvin, [place], [place] 12/31/19 1024

## 2019-12-31 NOTE — Discharge Instructions (Addendum)
Keep area(s) clean and dry. °Apply hot compress / towel for 5-10 minutes 3-5 times daily. °Take antibiotic as prescribed with food - important to complete course. °Return for worsening pain, redness, swelling, discharge, fever. ° °Helpful prevention tips: °Keep nails short to avoid secondary skin infections. °Use new, clean razors when shaving. °Avoid antiperspirants - look for deodorants without aluminum. °Avoid wearing underwire bras as this can irritate the area further.  °

## 2019-12-31 NOTE — ED Triage Notes (Signed)
Patient is here with complaints of a bump on her vaginal area that she noticed x 3 days ago.

## 2020-02-17 ENCOUNTER — Ambulatory Visit
Admission: EM | Admit: 2020-02-17 | Discharge: 2020-02-17 | Disposition: A | Discharge status: Home / Self Care | Payer: Medicaid Other | Attending: Physician Assistant | Admitting: Physician Assistant

## 2020-02-17 DIAGNOSIS — K0889 Other specified disorders of teeth and supporting structures: Secondary | ICD-10-CM

## 2020-02-17 MED ORDER — PENICILLIN V POTASSIUM 500 MG PO TABS: 500.0000 mg | ORAL_TABLET | Freq: Four times a day (QID) | ORAL | 0 refills | Status: AC

## 2020-02-17 NOTE — Discharge Instructions (Signed)
Start penicillin as directed. Tylenol 1000mg  three times a day. Salt water gurgles. Follow up with dentist for further treatment and evaluation. If experiencing swelling of the throat, trouble breathing, trouble swallowing, leaning forward to breath, drooling, go to the emergency department for further evaluation.

## 2020-02-17 NOTE — ED Provider Notes (Signed)
[name]    CSN: [id number] Arrival date & time: 02/17/20  1308      History   Chief Complaint Chief Complaint  Patient presents with  . Dental Pain    HPI Brianna Dawson is a 24 y.o. female.   24 year old female comes in for 2 day history of right lower dental pain. Has had gum swelling, and noticed open wound. Denies injury/trauma. Denies known cavity/cracked tooth. Facial swelling without trouble breathing, swelling of the throat, tripoding, drooling, trismus. Denies fever. Took tylenol without relief. States has never seen a Pharmacist, community before.  Patient has had positive urine preg, thinks she is [redacted] weeks pregnant, has OB establishment scheduled. Denies abdominal pain, vagial bleeding.      Past Medical History:  Diagnosis Date  . Medical history non-contributory     Patient Active Problem List   Diagnosis Date Noted  . Indication for care in labor or delivery 07/18/2018  . SVD (spontaneous vaginal delivery) 07/18/2018  . Fetal cardiac echogenic focus 03/24/2018  . [redacted] weeks gestation of pregnancy   . Encounter for fetal anatomic survey   . Supervision of low-risk pregnancy 12/22/2017    Past Surgical History:  Procedure Laterality Date  . NO PAST SURGERIES      OB History    Gravida  3   Para  2   Term  2   Preterm  0   AB  0   Living  2     SAB  0   TAB  0   Ectopic  0   Multiple  0   Live Births  2            Home Medications    Prior to Admission medications   Medication Sig Start Date End Date Taking? Authorizing Provider  albuterol (VENTOLIN HFA) 108 (90 Base) MCG/ACT inhaler Inhale 2 puffs into the lungs every 4 (four) hours as needed for wheezing or shortness of breath. 12/18/19   Hall-Potvin, [place], PA-C  penicillin v potassium (VEETID) 500 MG tablet Take 1 tablet (500 mg total) by mouth 4 (four) times daily for 7 days. 02/17/20 02/24/20  Ok Edwards, PA-C    Family History Family History  Problem Relation Age of  Onset  . Hypertension Maternal Grandfather     Social History Social History   Tobacco Use  . Smoking status: Light Tobacco Smoker    Types: Cigars  . Smokeless tobacco: Never Used  Substance Use Topics  . Alcohol use: No  . Drug use: No     Allergies   Patient has no known allergies.   Review of Systems Review of Systems  Reason unable to perform ROS: See HPI as above.     Physical Exam Triage Vital Signs ED Triage Vitals  Enc Vitals Group     BP 02/17/20 1315 118/74     Pulse Rate 02/17/20 1315 88     Resp 02/17/20 1315 18     Temp 02/17/20 1315 98.5 F (36.9 C)     Temp Source 02/17/20 1315 Oral     SpO2 02/17/20 1315 97 %     Weight --      Height --      Head Circumference --      Peak Flow --      Pain Score 02/17/20 1334 7     Pain Loc --      Pain Edu? --      Excl. in [place]? --  No data found.  Updated Vital Signs BP 118/74 (BP Location: Left Arm)   Pulse 88   Temp 98.5 F (36.9 C) (Oral)   Resp 18   LMP 11/18/2019   SpO2 97%   Physical Exam Constitutional:      General: She is not in acute distress.    Appearance: She is well-developed. She is not ill-appearing, toxic-appearing or diaphoretic.  HENT:     Head: Normocephalic and atraumatic.     Jaw: No trismus.     Mouth/Throat:     Mouth: Mucous membranes are moist.     Pharynx: Oropharynx is clear. Uvula midline. No uvula swelling.     Tonsils: No tonsillar exudate.     Comments: Right lower wisdom tooth with gum swelling surrounding tooth. Gum erythematous. Tender to palpation.  Floor of mouth soft to palpation. Right facial swelling. Handling own secretions well. No trismus.  Musculoskeletal:     Cervical back: Normal range of motion and neck supple.  Skin:    General: Skin is warm and dry.  Neurological:     Mental Status: She is alert and oriented to person, place, and time.      UC Treatments / Results  Labs (all labs ordered are listed, but only abnormal results are  displayed) Labs Reviewed - No data to display  EKG   Radiology No results found.  Procedures Procedures (including critical care time)  Medications Ordered in UC Medications - No data to display  Initial Impression / Assessment and Plan / UC Course  I have reviewed the triage vital signs and the nursing notes.  Pertinent labs & imaging results that were available during my care of the patient were reviewed by me and considered in my medical decision making (see chart for details).    Start antibiotics for possible dental infection. Symptomatic treatment as needed. Discussed with patient symptoms can return if dental problem is not addressed. Follow up with dentist for further evaluation and treatment of dental pain. Resources given. Return precautions given.   Final Clinical Impressions(s) / UC Diagnoses   Final diagnoses:  Pain, dental   ED Prescriptions    Medication Sig Dispense Auth. Provider   penicillin v potassium (VEETID) 500 MG tablet Take 1 tablet (500 mg total) by mouth 4 (four) times daily for 7 days. 28 tablet Belinda Fisher, PA-C     PDMP not reviewed this encounter.   Belinda Fisher, PA-C 02/17/20 1503

## 2020-02-17 NOTE — ED Triage Notes (Addendum)
Pt c/o toothache to rt lower teeth. States has a blister to gums with swelling and pain x2 days

## 2020-02-22 ENCOUNTER — Ambulatory Visit (INDEPENDENT_AMBULATORY_CARE_PROVIDER_SITE_OTHER): Payer: Medicaid Other | Admitting: *Deleted

## 2020-02-22 ENCOUNTER — Other Ambulatory Visit: Payer: Self-pay

## 2020-02-22 DIAGNOSIS — Z3490 Encounter for supervision of normal pregnancy, unspecified, unspecified trimester: Secondary | ICD-10-CM | POA: Insufficient documentation

## 2020-02-22 MED ORDER — PRENATAL 27-0.8 MG PO TABS: 1.0000 | ORAL_TABLET | Freq: Every day | ORAL | 9 refills | Status: DC

## 2020-02-22 MED ORDER — BLOOD PRESSURE KIT DEVI: 1.0000 | 0 refills | Status: DC | PRN

## 2020-02-22 NOTE — Patient Instructions (Signed)

## 2020-02-22 NOTE — Progress Notes (Signed)
I connected with  Brianna Dawson on 02/22/20 at  8:30 AM EDT by telephone and verified that I am speaking with the correct person using two identifiers.   I discussed the limitations, risks, security and privacy concerns of performing an evaluation and management service by telephone and the availability of in person appointments. I also discussed with the patient that there may be a patient responsible charge related to this service. The patient expressed understanding and agreed to proceed.  I explained I am completing her New OB Intake today. We discussed Her EDD and that it is based on  sure LMP. She states she is sure of her LMP dates but period a little irregular- doesn't start exactly every 28 days, varies.  I reviewed her allergies, meds, OB History, Medical /Surgical history, and appropriate screenings. I informed her of [facility] services. She declines at this time.   I explained I will send her the Bab[employer] app and app was sent to her while on phone. She states she cannot download while on phone; but will do after the call ends.  I explained we will send a blood pressure cuff to [facility] that will fill that prescription and we called [facility] to verify they received her prescription and confirmed they will deliver to the patient at her request. I asked her to bring the blood pressure cuff with her to her first ob appointment so we can show her how to use it. Explained  then we will have her take her blood pressure weekly and enter into the app. I explained she will have some visits in office and some virtually. She already has Sports coach. I reviewed her new ob  appointment date/ time with her , our location and to wear mask, no visitors.  I explained she will have a pelvic exam, ob bloodwork, hemoglobin a1C, cbg ,pap, and  genetic testing if desired,- she does want a panorama. I scheduled an [place] at 19 weeks and gave her the appointment. She voices understanding.  Lynell Kussman,RN 02/22/2020  8:41  AM

## 2020-02-22 NOTE — Progress Notes (Signed)
Patient seen and assessed by nursing staff during this encounter. I have reviewed the chart and agree with the documentation and plan. I have also made any necessary editorial changes.  Thressa Sheller DNP, CNM  02/22/20  11:46 AM

## 2020-02-29 ENCOUNTER — Other Ambulatory Visit: Payer: Self-pay

## 2020-02-29 ENCOUNTER — Encounter: Payer: Self-pay | Admitting: Advanced Practice Midwife

## 2020-02-29 ENCOUNTER — Ambulatory Visit (INDEPENDENT_AMBULATORY_CARE_PROVIDER_SITE_OTHER): Payer: Medicaid Other | Admitting: Advanced Practice Midwife

## 2020-02-29 ENCOUNTER — Other Ambulatory Visit (HOSPITAL_COMMUNITY)
Admission: RE | Admit: 2020-02-29 | Discharge: 2020-02-29 | Disposition: A | Discharge status: Home / Self Care | Payer: Medicaid Other | Source: Ambulatory Visit | Attending: Advanced Practice Midwife | Admitting: Advanced Practice Midwife

## 2020-02-29 VITALS — BP 115/73 | HR 99 | Wt 197.9 lb

## 2020-02-29 DIAGNOSIS — Z3A14 14 weeks gestation of pregnancy: Secondary | ICD-10-CM

## 2020-02-29 DIAGNOSIS — Z3490 Encounter for supervision of normal pregnancy, unspecified, unspecified trimester: Secondary | ICD-10-CM | POA: Diagnosis present

## 2020-02-29 LAB — POCT URINALYSIS DIP (DEVICE)
Bilirubin Urine: NEGATIVE
Glucose, UA: NEGATIVE mg/dL
Hgb urine dipstick: NEGATIVE
Ketones, ur: NEGATIVE mg/dL
Leukocytes,Ua: NEGATIVE
Nitrite: NEGATIVE
Protein, ur: 30 mg/dL — AB
Specific Gravity, Urine: >=1.03 (ref 1.005–1.030)
Urobilinogen, UA: 1 mg/dL (ref 0.0–1.0)
pH: 7 (ref 5.0–8.0)

## 2020-02-29 NOTE — Progress Notes (Signed)
Unsure of period and was irregular then was pregnant.  Will wait for u/s for actual dating

## 2020-02-29 NOTE — Progress Notes (Signed)
Subjective:   Brianna Dawson is a 24 y.o. G3P2002 at 38w5dby LMP being seen today for her first obstetrical visit.  Her obstetrical history is significant for none. Patient unsure about her intent to breast feed. Pregnancy history fully reviewed.  Patient reports no complaints.  HISTORY: OB History  Gravida Para Term Preterm AB Living  3 2 2  0 0 2  SAB TAB Ectopic Multiple Live Births  0 0 0 0 2    # Outcome Date GA Lbr Len/2nd Weight Sex Delivery Anes PTL Lv  3 Current           2 Term 07/18/18 331w2d6:23 / 00:05 6 lb 12.5 oz (3.075 kg) F Vag-Spont None  LIV     Birth Comments: wnl     Name: Brianna Dawson     Apgar1: 8  Apgar5: 9  1 Term 09/14/13 4062w1d:02 / 00:12 7 lb 1.8 oz (3.226 kg) F Vag-Spont EPI  LIV     Birth Comments: wnl     Name: Brianna Dawson     Apgar1: 8  Apgar5: 9    Last pap smear was done none and was NA  Past Medical History:  Diagnosis Date  . Medical history non-contributory    Past Surgical History:  Procedure Laterality Date  . NO PAST SURGERIES     Family History  Problem Relation Age of Onset  . Hypertension Maternal Grandfather    Social History   Tobacco Use  . Smoking status: Former Smoker    Types: Cigars    Quit date: 01/22/2020    Years since quitting: 0.1  . Smokeless tobacco: Never Used  . Tobacco comment: 2 cigars a day  Substance Use Topics  . Alcohol use: Not Currently    Comment: socially, occasionally  . Drug use: Not Currently    Types: Marijuana    Comment: last time several years ago   No Known Allergies Current Outpatient Medications on File Prior to Visit  Medication Sig Dispense Refill  . albuterol (VENTOLIN HFA) 108 (90 Base) MCG/ACT inhaler Inhale 2 puffs into the lungs every 4 (four) hours as needed for wheezing or shortness of breath. 18 g 0  . Blood Pressure Monitoring (BLOOD PRESSURE KIT) DEVI 1 Device by Does not apply route as needed. 1 each 0  . Prenatal Vit-Fe Fumarate-FA  (MULTIVITAMIN-PRENATAL) 27-0.8 MG TABS tablet Take 1 tablet by mouth daily at 12 noon. 30 tablet 9  . Prenatal Vit-Fe Fumarate-FA (PRENATAL VITAMINS PO) Take 1 tablet by mouth daily.     No current facility-administered medications on file prior to visit.    Review of Systems Pertinent items noted in HPI and remainder of comprehensive ROS otherwise negative.  Exam   Vitals:   02/29/20 0907  BP: 115/73  Pulse: 99  Weight: 197 lb 14.4 oz (89.8 kg)   Fetal Heart Rate (bpm): 144  Physical Exam  Constitutional: She is oriented to person, place, and time and well-developed, well-nourished, and in no distress. No distress.  HENT:  Head: Normocephalic.  Cardiovascular: Normal rate.  Pulmonary/Chest: Effort normal.  Abdominal: Soft. There is no abdominal tenderness. There is no rebound.  Neurological: She is alert and oriented to person, place, and time.  Skin: Skin is warm and dry.  Psychiatric: Affect normal.  Nursing note and vitals reviewed.   Assessment:   Pregnancy: [id number] Active Problem List   Diagnosis Date Noted  . Supervision of low-risk pregnancy 02/22/2020  . Indication for care  in labor or delivery 07/18/2018  . SVD (spontaneous vaginal delivery) 07/18/2018  . Fetal cardiac echogenic focus 03/24/2018  . [redacted] weeks gestation of pregnancy   . Encounter for fetal anatomic survey      Plan:  1. Encounter for supervision of low-risk pregnancy, antepartum - Culture, OB Urine - Genetic Screening - Obstetric Panel, Including HIV - Hemoglobin A1c - Cytology - PAP( [facility]) - Cervicovaginal ancillary only( [facility]) - Hepatitis C Antibody   Initial labs drawn. Continue prenatal vitamins. Genetic Screening discussed, AFP and NIPS: requested. Ultrasound discussed; fetal anatomic survey: requested. Problem list reviewed and updated. The nature of [facility] with multiple MDs and other Advanced Practice Providers  was explained to patient; also emphasized that residents, students are part of our team. Routine obstetric precautions reviewed. 50% of 45 min visit spent in counseling and coordination of care. Return in about 4 weeks (around 03/28/2020) for in person visit .   Marcille Buffy DNP, CNM  02/29/20  10:05 AM

## 2020-02-29 NOTE — Patient Instructions (Signed)
[facility] [address] [place], [place] [address] Phone: [phone] ext. [phone] Fax: [phone]  [email or web address]  [email or web address]

## 2020-03-01 LAB — HEPATITIS C ANTIBODY: Hep C Virus Ab: <0.1 {s_co_ratio} (ref 0.0–0.9)

## 2020-03-01 LAB — OBSTETRIC PANEL, INCLUDING HIV
Antibody Screen: NEGATIVE
Basophils Absolute: 0 10*3/uL (ref 0.0–0.2)
Basos: 0 %
EOS (ABSOLUTE): 0.2 10*3/uL (ref 0.0–0.4)
Eos: 2 %
HIV Screen 4th Generation wRfx: NONREACTIVE
Hematocrit: 37.6 % (ref 34.0–46.6)
Hemoglobin: 11.8 g/dL (ref 11.1–15.9)
Hepatitis B Surface Ag: NEGATIVE
Immature Grans (Abs): 0 10*3/uL (ref 0.0–0.1)
Immature Granulocytes: 0 %
Lymphocytes Absolute: 2.2 10*3/uL (ref 0.7–3.1)
Lymphs: 24 %
MCH: 24 pg — ABNORMAL LOW (ref 26.6–33.0)
MCHC: 31.4 g/dL — ABNORMAL LOW (ref 31.5–35.7)
MCV: 77 fL — ABNORMAL LOW (ref 79–97)
Monocytes Absolute: 0.8 10*3/uL (ref 0.1–0.9)
Monocytes: 9 %
Neutrophils Absolute: 6 10*3/uL (ref 1.4–7.0)
Neutrophils: 65 %
Platelets: 199 10*3/uL (ref 150–450)
RBC: 4.91 x10E6/uL (ref 3.77–5.28)
RDW: 14.6 % (ref 11.7–15.4)
RPR Ser Ql: NONREACTIVE
Rh Factor: POSITIVE
Rubella Antibodies, IGG: 11.7 index (ref >0.99)
WBC: 9.3 10*3/uL (ref 3.4–10.8)

## 2020-03-01 LAB — CYTOLOGY - PAP
Chlamydia: NEGATIVE
Comment: NEGATIVE
Comment: NORMAL
Diagnosis: NEGATIVE
Neisseria Gonorrhea: NEGATIVE

## 2020-03-01 LAB — CERVICOVAGINAL ANCILLARY ONLY
Bacterial Vaginitis (gardnerella): POSITIVE — AB
Candida Glabrata: NEGATIVE
Candida Vaginitis: NEGATIVE
Comment: NEGATIVE
Comment: NEGATIVE
Comment: NEGATIVE
Comment: NEGATIVE
Trichomonas: NEGATIVE

## 2020-03-01 LAB — HEMOGLOBIN A1C
Est. average glucose Bld gHb Est-mCnc: 111 mg/dL
Hgb A1c MFr Bld: 5.5 % (ref 4.8–5.6)

## 2020-03-02 LAB — CULTURE, OB URINE

## 2020-03-02 LAB — URINE CULTURE, OB REFLEX

## 2020-03-06 ENCOUNTER — Encounter: Payer: Self-pay | Admitting: *Deleted

## 2020-03-12 ENCOUNTER — Encounter: Payer: Self-pay | Admitting: *Deleted

## 2020-03-15 ENCOUNTER — Telehealth: Payer: Self-pay | Admitting: Lactation Services

## 2020-03-15 NOTE — Telephone Encounter (Signed)
Attempted to call patient to inform of [facility] results. Patient did not answer and mailbox was full no not able to leave a message. My Chart Message sent.

## 2020-03-28 ENCOUNTER — Encounter: Payer: Medicaid Other | Admitting: Family Medicine

## 2020-03-29 ENCOUNTER — Other Ambulatory Visit (HOSPITAL_COMMUNITY)
Admission: RE | Admit: 2020-03-29 | Discharge: 2020-03-29 | Disposition: A | Discharge status: Home / Self Care | Payer: Medicaid Other | Source: Ambulatory Visit | Attending: Obstetrics and Gynecology | Admitting: Obstetrics and Gynecology

## 2020-03-29 ENCOUNTER — Ambulatory Visit (INDEPENDENT_AMBULATORY_CARE_PROVIDER_SITE_OTHER): Payer: Medicaid Other | Admitting: Family Medicine

## 2020-03-29 ENCOUNTER — Other Ambulatory Visit: Payer: Self-pay | Admitting: *Deleted

## 2020-03-29 ENCOUNTER — Telehealth: Payer: Self-pay | Admitting: Obstetrics & Gynecology

## 2020-03-29 ENCOUNTER — Other Ambulatory Visit: Payer: Self-pay

## 2020-03-29 VITALS — BP 112/70 | HR 95 | Wt 202.0 lb

## 2020-03-29 DIAGNOSIS — N898 Other specified noninflammatory disorders of vagina: Secondary | ICD-10-CM | POA: Diagnosis present

## 2020-03-29 DIAGNOSIS — Z3492 Encounter for supervision of normal pregnancy, unspecified, second trimester: Secondary | ICD-10-CM | POA: Insufficient documentation

## 2020-03-29 DIAGNOSIS — Z3A19 19 weeks gestation of pregnancy: Secondary | ICD-10-CM

## 2020-03-29 DIAGNOSIS — B9689 Other specified bacterial agents as the cause of diseases classified elsewhere: Secondary | ICD-10-CM

## 2020-03-29 DIAGNOSIS — N760 Acute vaginitis: Secondary | ICD-10-CM

## 2020-03-29 DIAGNOSIS — O98812 Other maternal infectious and parasitic diseases complicating pregnancy, second trimester: Secondary | ICD-10-CM

## 2020-03-29 DIAGNOSIS — O99891 Other specified diseases and conditions complicating pregnancy: Secondary | ICD-10-CM

## 2020-03-29 DIAGNOSIS — Z3490 Encounter for supervision of normal pregnancy, unspecified, unspecified trimester: Secondary | ICD-10-CM

## 2020-03-29 DIAGNOSIS — B379 Candidiasis, unspecified: Secondary | ICD-10-CM

## 2020-03-29 NOTE — Telephone Encounter (Signed)
   Duchess Armendarez DOB: 02-17-1996 MRN: [id number]   RIDER WAIVER AND RELEASE OF LIABILITY  For purposes of improving physical access to our facilities, [facility] is pleased to partner with third parties to provide [facility] patients or other authorized individuals the option of convenient, on-demand ground transportation services (the [employer]") through use of the technology service that enables users to request on-demand ground transportation from independent third-party providers.  By opting to use and accept these [employer], I, the undersigned, hereby agree on behalf of myself, and on behalf of any minor child using the [employer] for whom I am the parent or legal guardian, as follows:  1. Science writer provided to me are provided by independent third-party transportation providers who are not [employer] or employees and who are unaffiliated with [employer]. 2. [facility] is neither a transportation carrier nor a common or public carrier. 3. [facility] has no control over the quality or safety of the transportation that occurs as a result of the [employer]. 4. [place] [facility] cannot guarantee that any third-party transportation provider will complete any arranged transportation service. 5. [facility] makes no representation, warranty, or guarantee regarding the reliability, timeliness, quality, safety, suitability, or availability of any of the Transport Services or that they will be error free. 6. I fully understand that traveling by vehicle involves risks and dangers of serious bodily injury, including permanent disability, paralysis, and death. I agree, on behalf of myself and on behalf of any minor child using the Transport Services for whom I am the parent or legal guardian, that the entire risk arising out of my use of the [employer] remains solely with me, to the maximum extent permitted under applicable law. 7. The [employer] are provided "as is" and "as available." [facility] disclaims all representations and warranties, express, implied or statutory, not expressly set out in these terms, including the implied warranties of merchantability and fitness for a particular purpose. 8. I hereby waive and release [facility], its agents, employees, officers, directors, representatives, insurers, attorneys, assigns, successors, subsidiaries, and affiliates from any and all past, present, or future claims, demands, liabilities, actions, causes of action, or suits of any kind directly or indirectly arising from acceptance and use of the [employer]. 9. I further waive and release [facility] and its affiliates from all present and future liability and responsibility for any injury or death to persons or damages to property caused by or related to the use of the [employer]. 10. I have read this Waiver and Release of Liability, and I understand the terms used in it and their legal significance. This Waiver is freely and voluntarily given with the understanding that my right (as well as the right of any minor child for whom I am the parent or legal guardian using the [employer]) to legal recourse against [facility] in connection with the [employer] is knowingly surrendered in return for use of these services.   I attest that I read the consent document to Claudia Pollock, gave Ms. Garrabrant the opportunity to ask questions and answered the questions asked (if any). I affirm that Claudia Pollock then provided consent for she's participation in this program.     Launa Grill

## 2020-03-29 NOTE — Patient Instructions (Signed)
Considering Waterbirth? °Guide for patients at [facility] °Why consider waterbirth? °• Gentle birth for babies  °• Less pain medicine used in labor  °• May allow for passive descent/less pushing  °• May reduce perineal tears  °• More mobility and instinctive maternal position changes  °• Increased maternal relaxation  °• Reduced blood pressure in labor  ° °Is waterbirth safe? What are the risks of infection, drowning or other complications? °• Infection:  °• Very low risk (3.7 % for tub vs 4.8% for bed)  °• 7 in 8000 waterbirths with documented infection  °• Poorly cleaned equipment most common cause  °• Slightly lower group B strep transmission rate  °• Drowning  °• Maternal:  °• Very low risk  °• Related to seizures or fainting  °• Newborn:  °• Very low risk. No evidence of increased risk of respiratory problems in multiple large studies  °• Physiological protection from breathing under water  °• Avoid underwater birth if there are any fetal complications  °• Once baby's head is out of the water, keep it out.  °• Birth complication  °• Some reports of cord trauma, but risk decreased by bringing baby to surface gradually  °• No evidence of increased risk of shoulder dystocia. Mothers can usually change positions faster in water than in a bed, possibly aiding the maneuvers to free the shoulder.  °? °You must attend a Waterbirth class at [facility] °• 3rd Wednesday of every month from 7-9pm  °• Free  °• Register by calling [phone] or online at [email or web address]  °• Bring us the certificate from the class to your prenatal appointment  °Meet with a midwife at 36 weeks to see if you can still plan a waterbirth and to sign the consent.  ° °If you plan a waterbirth at [facility] [place], you can opt to purchase the following: °• Fish Net °• Bathing suit top (optional)  °• Long-handled mirror (optional)  °•  °Things that would  prevent you from having a waterbirth: °• Unknown or Positive COVID-19 diagnosis upon admission to hospital  °• Premature, <37wks  °• Previous cesarean birth  °• Presence of thick meconium-stained fluid  °• Multiple gestation (Twins, triplets, etc.)  °• Uncontrolled diabetes or gestational diabetes requiring medication  °• Hypertension requiring medication or diagnosis of pre-eclampsia  °• Heavy vaginal bleeding  °• Non-reassuring fetal heart rate  °• Active infection (MRSA, etc.). Group B Strep is NOT a contraindication for waterbirth.  °• If your labor has to be induced and induction method requires continuous monitoring of the baby's heart rate  °• Other risks/issues identified by your obstetrical provider  °Please remember that birth is unpredictable. Under certain unforeseeable circumstances your provider may advise against giving birth in the tub. These decisions will be made on a case-by-case basis and with the safety of you and your baby as our highest priority. ° °**Please remember that in order to have a waterbirth, you must test Negative to COVID-19 upon admission to the hospital.** ° °

## 2020-03-29 NOTE — Progress Notes (Signed)
Think she has yeast infection.

## 2020-03-30 ENCOUNTER — Other Ambulatory Visit: Payer: Medicaid Other

## 2020-03-30 ENCOUNTER — Other Ambulatory Visit: Payer: Self-pay | Admitting: Advanced Practice Midwife

## 2020-03-30 ENCOUNTER — Ambulatory Visit: Payer: Medicaid Other

## 2020-03-30 ENCOUNTER — Ambulatory Visit: Payer: Medicaid Other | Attending: Advanced Practice Midwife

## 2020-03-30 DIAGNOSIS — Z363 Encounter for antenatal screening for malformations: Secondary | ICD-10-CM | POA: Diagnosis not present

## 2020-03-30 DIAGNOSIS — E669 Obesity, unspecified: Secondary | ICD-10-CM | POA: Diagnosis not present

## 2020-03-30 DIAGNOSIS — Z148 Genetic carrier of other disease: Secondary | ICD-10-CM

## 2020-03-30 DIAGNOSIS — Z3490 Encounter for supervision of normal pregnancy, unspecified, unspecified trimester: Secondary | ICD-10-CM

## 2020-03-30 DIAGNOSIS — O99212 Obesity complicating pregnancy, second trimester: Secondary | ICD-10-CM | POA: Insufficient documentation

## 2020-03-30 DIAGNOSIS — Z3A19 19 weeks gestation of pregnancy: Secondary | ICD-10-CM

## 2020-03-30 DIAGNOSIS — Z362 Encounter for other antenatal screening follow-up: Secondary | ICD-10-CM | POA: Diagnosis present

## 2020-03-30 LAB — CERVICOVAGINAL ANCILLARY ONLY
Bacterial Vaginitis (gardnerella): POSITIVE — AB
Candida Glabrata: NEGATIVE
Candida Vaginitis: POSITIVE — AB
Chlamydia: NEGATIVE
Comment: NEGATIVE
Comment: NEGATIVE
Comment: NEGATIVE
Comment: NEGATIVE
Comment: NEGATIVE
Comment: NORMAL
Neisseria Gonorrhea: NEGATIVE
Trichomonas: NEGATIVE

## 2020-03-30 MED ORDER — TERCONAZOLE 0.8 % VA CREA: 1.0000 | TOPICAL_CREAM | Freq: Every day | VAGINAL | 0 refills | Status: DC

## 2020-03-30 MED ORDER — METRONIDAZOLE 500 MG PO TABS: 500.0000 mg | ORAL_TABLET | Freq: Two times a day (BID) | ORAL | 0 refills | Status: DC

## 2020-03-30 NOTE — Progress Notes (Signed)
   PRENATAL VISIT NOTE  Subjective:  Brianna Dawson is a 24 y.o. G3P2002 at [redacted]w[redacted]d being seen today for ongoing prenatal care.  She is currently monitored for the following issues for this low-risk pregnancy and has Indication for care in labor or delivery and Supervision of low-risk pregnancy on their problem list.  Patient reports vaginal irritation.   . Vag. Bleeding: None.  Movement: Present. Denies leaking of fluid.   The following portions of the patient's history were reviewed and updated as appropriate: allergies, current medications, past family history, past medical history, past social history, past surgical history and problem list.   Objective:   Vitals:   03/29/20 1549  BP: 112/70  Pulse: 95  Weight: 202 lb (91.6 kg)    Fetal Status: Fetal Heart Rate (bpm): 154   Movement: Present     General:  Alert, oriented and cooperative. Patient is in no acute distress.  Skin: Skin is warm and dry. No rash noted.   Cardiovascular: Normal heart rate noted  Respiratory: Normal respiratory effort, no problems with respiration noted  Abdomen: Soft, gravid, appropriate for gestational age.  Pain/Pressure: Absent     Pelvic: Cervical exam performed in the presence of a chaperone        Extremities: Normal range of motion.  Edema: None  Mental Status: Normal mood and affect. Normal behavior. Normal judgment and thought content.   Assessment and Plan:  Pregnancy: G3P2002 at [redacted]w[redacted]d 1. Encounter for supervision of low-risk pregnancy in second trimester Updated OB box Will think about carrier testing for FOB - Cervicovaginal ancillary only( [place])  2. Vaginal discharge - Cervicovaginal ancillary only( [place])  Preterm labor symptoms and general obstetric precautions including but not limited to vaginal bleeding, contractions, leaking of fluid and fetal movement were reviewed in detail with the patient. Please refer to After Visit Summary for other counseling recommendations.    No follow-ups on file.  Future Appointments  Date Time Provider Department Center  04/26/2020  8:55 AM Rasch, Harolyn Rutherford, NP [facility]    Federico Flake, MD

## 2020-03-30 NOTE — Addendum Note (Signed)
Addended by: Geanie Berlin on: 03/30/2020 05:58 PM   Modules accepted: Orders

## 2020-04-01 LAB — AFP, SERUM, OPEN SPINA BIFIDA
AFP MoM: 1.07
AFP Value: 49.7 ng/mL
Gest. Age on Collection Date: 18.9 weeks
Maternal Age At EDD: 23.3 yr
OSBR Risk 1 IN: 10000
Test Results:: NEGATIVE
Weight: 198 [lb_av]

## 2020-04-01 LAB — SJOGREN'S SYNDROME ANTIBODS(SSA + SSB)
ENA SSA (RO) Ab: <0.2 AI (ref 0.0–0.9)
ENA SSB (LA) Ab: <0.2 AI (ref 0.0–0.9)

## 2020-04-04 ENCOUNTER — Encounter: Payer: Self-pay | Admitting: *Deleted

## 2020-04-04 ENCOUNTER — Other Ambulatory Visit: Payer: Self-pay | Admitting: *Deleted

## 2020-04-04 DIAGNOSIS — O285 Abnormal chromosomal and genetic finding on antenatal screening of mother: Secondary | ICD-10-CM | POA: Insufficient documentation

## 2020-04-06 ENCOUNTER — Telehealth: Payer: Self-pay | Admitting: Obstetrics and Gynecology

## 2020-04-06 NOTE — Telephone Encounter (Signed)
I called Ms. Virgin to discuss Ro and La antibody screening results. Fetal echo performed ([facility]) on 03/31/20 was reported as normal. No CHB. Normal heart rate and rhythm. Ro and La antibodies negative.  I reassured the patient of the results. She has a follow-up appointment on 04/26/20.

## 2020-04-09 ENCOUNTER — Telehealth: Payer: Self-pay

## 2020-04-09 NOTE — Telephone Encounter (Addendum)
-----   Message from Drucilla Schmidt Day, RN sent at 04/05/2020  6:05 PM EDT ----- Pt was counseled by Dr. Alvester Morin on 5/27 regarding carrier status and will consider testing for FOB. Pt needs to be called and offered Genetic Counseling appt - order has been placed. ----- Message ----- From: Armando Reichert, CNM Sent: 04/04/2020   3:44 PM EDT To: [facility] Clinical Pool  Patient needs genetic counseling visit and partner testing. 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  MyChart message sent.  Pt also has an appt schedule on 04/26/20 in which we can advise.  MyChart message sent.    Pt returned call and pt informed me that she has received the testing for the FOB from [facility] and they are going to send it back to them.  Pt asked if the FOB test positive then what does not mean.  I informed pt that Avelina Laine would be able to better explain to her what it means.  Pt verbalized understanding.   Addison Naegeli, RN  04/09/20

## 2020-04-26 ENCOUNTER — Encounter: Payer: Medicaid Other | Admitting: Obstetrics and Gynecology

## 2020-04-26 ENCOUNTER — Other Ambulatory Visit: Payer: Self-pay

## 2020-04-26 ENCOUNTER — Ambulatory Visit (INDEPENDENT_AMBULATORY_CARE_PROVIDER_SITE_OTHER): Payer: Medicaid Other | Admitting: Obstetrics and Gynecology

## 2020-04-26 VITALS — BP 113/70 | HR 85 | Wt 205.0 lb

## 2020-04-26 DIAGNOSIS — Z3A22 22 weeks gestation of pregnancy: Secondary | ICD-10-CM

## 2020-04-26 DIAGNOSIS — O368320 Maternal care for abnormalities of the fetal heart rate or rhythm, second trimester, not applicable or unspecified: Secondary | ICD-10-CM

## 2020-04-26 DIAGNOSIS — Z3492 Encounter for supervision of normal pregnancy, unspecified, second trimester: Secondary | ICD-10-CM

## 2020-04-26 DIAGNOSIS — O368390 Maternal care for abnormalities of the fetal heart rate or rhythm, unspecified trimester, not applicable or unspecified: Secondary | ICD-10-CM

## 2020-04-26 NOTE — Patient Instructions (Signed)
Considering Waterbirth? °Guide for patients at [facility] °Why consider waterbirth? °• Gentle birth for babies  °• Less pain medicine used in labor  °• May allow for passive descent/less pushing  °• May reduce perineal tears  °• More mobility and instinctive maternal position changes  °• Increased maternal relaxation  °• Reduced blood pressure in labor  ° °Is waterbirth safe? What are the risks of infection, drowning or other complications? °• Infection:  °• Very low risk (3.7 % for tub vs 4.8% for bed)  °• 7 in 8000 waterbirths with documented infection  °• Poorly cleaned equipment most common cause  °• Slightly lower group B strep transmission rate  °• Drowning  °• Maternal:  °• Very low risk  °• Related to seizures or fainting  °• Newborn:  °• Very low risk. No evidence of increased risk of respiratory problems in multiple large studies  °• Physiological protection from breathing under water  °• Avoid underwater birth if there are any fetal complications  °• Once baby's head is out of the water, keep it out.  °• Birth complication  °• Some reports of cord trauma, but risk decreased by bringing baby to surface gradually  °• No evidence of increased risk of shoulder dystocia. Mothers can usually change positions faster in water than in a bed, possibly aiding the maneuvers to free the shoulder.  °? °You must attend a Waterbirth class at [facility] [place] °• 3rd Wednesday of every month from 7-9pm  °• Free  °• Register by calling [phone] or online at [email or web address]  °• Bring us the certificate from the class to your prenatal appointment  °Meet with a midwife at 36 weeks to see if you can still plan a waterbirth and to sign the consent.  ° °If you plan a waterbirth at [facility], you can opt to purchase the following: °• Fish Net °• Bathing suit top (optional)  °• Long-handled mirror (optional)  °•  °Things that would  prevent you from having a waterbirth: °• Unknown or Positive COVID-19 diagnosis upon admission to hospital  °• Premature, <37wks  °• Previous cesarean birth  °• Presence of thick meconium-stained fluid  °• Multiple gestation (Twins, triplets, etc.)  °• Uncontrolled diabetes or gestational diabetes requiring medication  °• Hypertension requiring medication or diagnosis of pre-eclampsia  °• Heavy vaginal bleeding  °• Non-reassuring fetal heart rate  °• Active infection (MRSA, etc.). Group B Strep is NOT a contraindication for waterbirth.  °• If your labor has to be induced and induction method requires continuous monitoring of the baby's heart rate  °• Other risks/issues identified by your obstetrical provider  °Please remember that birth is unpredictable. Under certain unforeseeable circumstances your provider may advise against giving birth in the tub. These decisions will be made on a case-by-case basis and with the safety of you and your baby as our highest priority. ° °**Please remember that in order to have a waterbirth, you must test Negative to COVID-19 upon admission to the hospital.** ° °

## 2020-04-26 NOTE — Progress Notes (Signed)
   PRENATAL VISIT NOTE  Subjective:  Brianna Dawson is a 24 y.o. G3P2002 at [redacted]w[redacted]d being seen today for ongoing prenatal care.  She is currently monitored for the following issues for this high-risk pregnancy and has Indication for care in labor or delivery; Supervision of low-risk pregnancy; and Abnormal genetic test in pregnancy on their problem list.  Patient reports no complaints.   . Vag. Bleeding: None.  Movement: Present. Denies leaking of fluid.   The following portions of the patient's history were reviewed and updated as appropriate: allergies, current medications, past family history, past medical history, past social history, past surgical history and problem list.   Objective:   Vitals:   04/26/20 1419  BP: 113/70  Pulse: 85  Weight: 205 lb (93 kg)    Fetal Status: Fetal Heart Rate (bpm): 138 Fundal Height: 22 cm Movement: Present     General:  Alert, oriented and cooperative. Patient is in no acute distress.  Skin: Skin is warm and dry. No rash noted.   Cardiovascular: Normal heart rate noted  Respiratory: Normal respiratory effort, no problems with respiration noted  Abdomen: Soft, gravid, appropriate for gestational age.  Pain/Pressure: Absent     Pelvic: Cervical exam deferred        Extremities: Normal range of motion.  Edema: Trace  Mental Status: Normal mood and affect. Normal behavior. Normal judgment and thought content.   Assessment and Plan:  Pregnancy: G3P2002 at [redacted]w[redacted]d   1. Encounter for supervision of low-risk pregnancy in second trimester  AFP negative  Doing well Discussed BC- unsure at this time.   2. Fetal bradycardia, antepartum condition or complication  - [place] MFM OB FOLLOW UP; Future - patient interested in water birth, needs f/u MFM [place] ASAP- doesn't look like it was scheduled - Given previous US showing bradycardia- normal fetal echo, patient to discuss water birth with CNM and MFM.  Fetal Echo done at [facility]. Fetal echocardiogram performed at  19 1/[redacted] weeks gestation Normal fetal echocardiogram Fetal heart rate 138 bpm Mechanical PR interval ranges from 110-120 msec"   Preterm labor symptoms and general obstetric precautions including but not limited to vaginal bleeding, contractions, leaking of fluid and fetal movement were reviewed in detail with the patient. Please refer to After Visit Summary for other counseling recommendations.   Return in about 4 weeks (around 05/24/2020), or ., for Needs 2 hour GTT scheduled with CNM next visit for water birth .  Future Appointments  Date Time Provider Department Center  05/03/2020 12:45 PM [facility] NURSE [facility]  05/03/2020 12:45 PM [facility] US5 [facility]  05/24/2020  3:35 PM Magnus Sinning, Dimas Alexandria, PA-C [facility]    Venia Carbon, NP

## 2020-05-03 ENCOUNTER — Ambulatory Visit: Payer: Medicaid Other | Attending: Obstetrics and Gynecology

## 2020-05-03 ENCOUNTER — Ambulatory Visit: Payer: Medicaid Other

## 2020-05-24 ENCOUNTER — Other Ambulatory Visit: Payer: Self-pay

## 2020-05-24 ENCOUNTER — Ambulatory Visit (INDEPENDENT_AMBULATORY_CARE_PROVIDER_SITE_OTHER): Payer: Medicaid Other | Admitting: Medical

## 2020-05-24 VITALS — BP 110/66 | HR 105 | Wt 208.0 lb

## 2020-05-24 DIAGNOSIS — O285 Abnormal chromosomal and genetic finding on antenatal screening of mother: Secondary | ICD-10-CM

## 2020-05-24 DIAGNOSIS — Z3A36 36 weeks gestation of pregnancy: Secondary | ICD-10-CM

## 2020-05-24 DIAGNOSIS — Z3492 Encounter for supervision of normal pregnancy, unspecified, second trimester: Secondary | ICD-10-CM

## 2020-05-24 NOTE — Patient Instructions (Addendum)
Braxton Hicks Contractions °Contractions of the uterus can occur throughout pregnancy, but they are not always a sign that you are in labor. You may have practice contractions called Braxton Hicks contractions. These false labor contractions are sometimes confused with true labor. °What are Braxton Hicks contractions? °Braxton Hicks contractions are tightening movements that occur in the muscles of the uterus before labor. Unlike true labor contractions, these contractions do not result in opening (dilation) and thinning of the cervix. Toward the end of pregnancy (32-34 weeks), Braxton Hicks contractions can happen more often and may become stronger. These contractions are sometimes difficult to tell apart from true labor because they can be very uncomfortable. You should not feel embarrassed if you go to the hospital with false labor. °Sometimes, the only way to tell if you are in true labor is for your health care provider to look for changes in the cervix. The health care provider will do a physical exam and may monitor your contractions. If you are not in true labor, the exam should show that your cervix is not dilating and your water has not broken. °If there are no other health problems associated with your pregnancy, it is completely safe for you to be sent home with false labor. You may continue to have Braxton Hicks contractions until you go into true labor. °How to tell the difference between true labor and false labor °True labor °· Contractions last 30-70 seconds. °· Contractions become very regular. °· Discomfort is usually felt in the top of the uterus, and it spreads to the lower abdomen and low back. °· Contractions do not go away with walking. °· Contractions usually become more intense and increase in frequency. °· The cervix dilates and gets thinner. °False labor °· Contractions are usually shorter and not as strong as true labor contractions. °· Contractions are usually irregular. °· Contractions  are often felt in the front of the lower abdomen and in the groin. °· Contractions may go away when you walk around or change positions while lying down. °· Contractions get weaker and are shorter-lasting as time goes on. °· The cervix usually does not dilate or become thin. °Follow these instructions at home: ° °· Take over-the-counter and prescription medicines only as told by your health care provider. °· Keep up with your usual exercises and follow other instructions from your health care provider. °· Eat and drink lightly if you think you are going into labor. °· If Braxton Hicks contractions are making you uncomfortable: °? Change your position from lying down or resting to walking, or change from walking to resting. °? Sit and rest in a tub of warm water. °? Drink enough fluid to keep your urine pale yellow. Dehydration may cause these contractions. °? Do slow and deep breathing several times an hour. °· Keep all follow-up prenatal visits as told by your health care provider. This is important. °Contact a health care provider if: °· You have a fever. °· You have continuous pain in your abdomen. °Get help right away if: °· Your contractions become stronger, more regular, and closer together. °· You have fluid leaking or gushing from your vagina. °· You pass blood-tinged mucus (bloody show). °· You have bleeding from your vagina. °· You have low back pain that you never had before. °· You feel your baby’s head pushing down and causing pelvic pressure. °· Your baby is not moving inside you as much as it used to. °Summary °· Contractions that occur before labor are   called Braxton Hicks contractions, false labor, or practice contractions.  Braxton Hicks contractions are usually shorter, weaker, farther apart, and less regular than true labor contractions. True labor contractions usually become progressively stronger and regular, and they become more frequent.  Manage discomfort from [facility] contractions  by changing position, resting in a warm bath, drinking plenty of water, or practicing deep breathing. This information is not intended to replace advice given to you by your health care provider. Make sure you discuss any questions you have with your health care provider. Document Revised: 10/02/2017 Document Reviewed: 03/05/2017 Elsevier Patient Education  2020 [employer].   Childbirth Education Options: [facility] Department Classes:  Childbirth education classes can help you get ready for a positive parenting experience. You can also meet other expectant parents and get free stuff for your baby. Each class runs for five weeks on the same night and costs $45 for the mother-to-be and her support person. Medicaid covers the cost if you are eligible. Call [phone] to register. [facility] Childbirth Education:  [phone] or [phone] or [email or web address]  Baby & Me Class: Discuss newborn & infant parenting and family adjustment issues with other new mothers in a relaxed environment. Each week brings a new speaker or baby-centered activity. We encourage new mothers to join [place] every Thursday at 11:00am. Babies birth until crawling. No registration or fee. Daddy [employer]: This course offers Dads-to-be the tools and knowledge needed to feel confident on their journey to becoming new fathers. Experienced dads, who have been trained as coaches, teach dads-to-be how to hold, comfort, diaper, swaddle and play with their infant while being able to support the new mom as well. A class for men taught by men. $25/dad Big Brother/Big Sister: Let your children share in the joy of a new brother or sister in this special class designed just for them. Class includes discussion about how families care for babies: swaddling, holding, diapering, safety as well as how they can be helpful in their new role. This class is designed for children ages 2 to 94, but any age is welcome. Please  register each child individually. $5/child  Mom Talk: This mom-led group offers support and connection to mothers as they journey through the adjustments and struggles of that sometimes overwhelming first year after the birth of a child. Tuesdays at 10:00am and Thursdays at 6:00pm. Babies welcome. No registration or fee. Breastfeeding Support Group: This group is a mother-to-mother support circle where moms have the opportunity to share their breastfeeding experiences. A Lactation Consultant is present for questions and concerns. Meets each Tuesday at 11:00am. No fee or registration. Breastfeeding Your Baby: Learn what to expect in the first days of breastfeeding your newborn.  This class will help you feel more confident with the skills needed to begin your breastfeeding experience. Many new mothers are concerned about breastfeeding after leaving the hospital. This class will also address the most common fears and challenges about breastfeeding during the first few weeks, months and beyond. (call for fee) Comfort Techniques and Tour: This 2 hour interactive class will provide you the opportunity to learn & practice hands-on techniques that can help relieve some of the discomfort of labor and encourage your baby to rotate toward the best position for birth. You and your partner will be able to try a variety of labor positions with birth balls and rebozos as well as practice breathing, relaxation, and visualization techniques. A tour of the [facility] is included  with this class. $20 per registrant and support person Childbirth Class- Weekend Option: This class is a Weekend version of our Birth & Baby series. It is designed for parents who have a difficult time fitting several weeks of classes into their schedule. It covers the care of your newborn and the basics of labor and childbirth. It also includes a Maternity Care Center Tour of [facility] and lunch. The class is held two  consecutive days: beginning on Friday evening from 6:30 - 8:30 p.m. and the next day, Saturday from 9 a.m. - 4 p.m. (call for fee) Linden Dolin Class: Interested in a waterbirth?  This informational class will help you discover whether waterbirth is the right fit for you. Education about waterbirth itself, supplies you would need and how to assemble your support team is what you can expect from this class. Some obstetrical practices require this class in order to pursue a waterbirth. (Not all obstetrical practices offer waterbirth-check with your healthcare provider.) Register only the expectant mom, but you are encouraged to bring your partner to class! Required if planning waterbirth, no fee. Infant/Child CPR: Parents, grandparents, babysitters, and friends learn Cardio-Pulmonary Resuscitation skills for infants and children. You will also learn how to treat both conscious and unconscious choking in infants and children. This Family & Friends program does not offer certification. Register each participant individually to ensure that enough mannequins are available. (Call for fee) Grandparent Love: Expecting a grandbaby? This class is for you! Learn about the latest infant care and safety recommendations and ways to support your own child as he or she transitions into the parenting role. Taught by Registered Nurses who are childbirth instructors, but most importantly...they are grandmothers too! $10/person. Childbirth Class- Natural Childbirth: This series of 5 weekly classes is for expectant parents who want to learn and practice natural methods of coping with the process of labor and childbirth. Relaxation, breathing, massage, visualization, role of the partner, and helpful positioning are highlighted. Participants learn how to be confident in their body's ability to give birth. This class will empower and help parents make informed decisions about their own care. Includes discussion that will help new parents  transition into the immediate postpartum period. Maternity Care Center Tour of [facility] is included. We suggest taking this class between 25-32 weeks, but it's only a recommendation. $75 per registrant and one support person or $30 Medicaid. Childbirth Class- 3 week Series: This option of 3 weekly classes helps you and your labor partner prepare for childbirth. Newborn care, labor & birth, cesarean birth, pain management, and comfort techniques are discussed and a Maternity Care Center Tour of [facility] is included. The class meets at the same time, on the same day of the week for 3 consecutive weeks beginning with the starting date you choose. $60 for registrant and one support person.  Marvelous Multiples: Expecting twins, triplets, or more? This class covers the differences in labor, birth, parenting, and breastfeeding issues that face multiples parents. NICU tour is included. Led by a Certified Childbirth Educator who is the mother of twins. No fee. Caring for Baby: This class is for expectant and adoptive parents who want to learn and practice the most up-to-date newborn care for their babies. Focus is on birth through the first six weeks of life. Topics include feeding, bathing, diapering, crying, umbilical cord care, circumcision care and safe sleep. Parents learn to recognize symptoms of illness and when to call the pediatrician. Register only the mom-to-be and your partner or  support person can plan to come with you! $10 per registrant and support person Childbirth Class- online option: This online class offers you the freedom to complete a Birth and Baby series in the comfort of your own home. The flexibility of this option allows you to review sections at your own pace, at times convenient to you and your support people. It includes additional video information, animations, quizzes, and extended activities. Get organized with helpful eClass tools, checklists, and trackers. Once you  register online for the class, you will receive an email within a few days to accept the invitation and begin the class when the time is right for you. The content will be available to you for 60 days. $60 for 60 days of online access for you and your support people.

## 2020-05-24 NOTE — Progress Notes (Signed)
° °  PRENATAL VISIT NOTE  Subjective:  Brianna Dawson is a 24 y.o. G3P2002 at [redacted]w[redacted]d being seen today for ongoing prenatal care.  She is currently monitored for the following issues for this low-risk pregnancy and has Supervision of low-risk pregnancy and Abnormal genetic test in pregnancy on their problem list.  Patient reports no complaints.  Contractions: Not present. Vag. Bleeding: None.  Movement: Present. Denies leaking of fluid.   The following portions of the patient's history were reviewed and updated as appropriate: allergies, current medications, past family history, past medical history, past social history, past surgical history and problem list.   Objective:   Vitals:   05/24/20 1545  BP: 110/66  Pulse: 105  Weight: (!) 208 lb (94.3 kg)    Fetal Status: Fetal Heart Rate (bpm): 137 Fundal Height: 25 cm Movement: Present     General:  Alert, oriented and cooperative. Patient is in no acute distress.  Skin: Skin is warm and dry. No rash noted.   Cardiovascular: Normal heart rate noted  Respiratory: Normal respiratory effort, no problems with respiration noted  Abdomen: Soft, gravid, appropriate for gestational age.  Pain/Pressure: Present     Pelvic: Cervical exam deferred        Extremities: Normal range of motion.  Edema: None  Mental Status: Normal mood and affect. Normal behavior. Normal judgment and thought content.   Assessment and Plan:  Pregnancy: G3P2002 at [redacted]w[redacted]d 1. Encounter for supervision of low-risk pregnancy in second trimester - Doing well - Anticipatory guidance for upcoming visits discussed including fasting labs at next visit  - Signed up for Waterbirth class  - Message sent to MFM to confirm no concerns for WB due to previous fetal bradycardia  - Considering IUD for MOC  2. Abnormal genetic test in pregnancy - Patient missed follow-up US on 6/24 due to work, will reschedule today  - Fetal Echo normal - discussed with patient   Preterm labor  symptoms and general obstetric precautions including but not limited to vaginal bleeding, contractions, leaking of fluid and fetal movement were reviewed in detail with the patient. Please refer to After Visit Summary for other counseling recommendations.   Return in about 2 weeks (around 06/07/2020) for LOB, In-Person, 28 week labs (fasting); CNM preferred.  No future appointments.  Vonzella Nipple, PA-C

## 2020-06-11 ENCOUNTER — Ambulatory Visit: Payer: Medicaid Other

## 2020-06-14 ENCOUNTER — Other Ambulatory Visit: Payer: Medicaid Other

## 2020-06-14 ENCOUNTER — Other Ambulatory Visit: Payer: Self-pay | Admitting: *Deleted

## 2020-06-14 DIAGNOSIS — Z3490 Encounter for supervision of normal pregnancy, unspecified, unspecified trimester: Secondary | ICD-10-CM

## 2020-06-15 ENCOUNTER — Other Ambulatory Visit: Payer: Self-pay | Admitting: *Deleted

## 2020-06-15 ENCOUNTER — Encounter: Payer: Self-pay | Admitting: *Deleted

## 2020-06-15 ENCOUNTER — Ambulatory Visit: Payer: Medicaid Other | Attending: Obstetrics and Gynecology | Admitting: *Deleted

## 2020-06-15 ENCOUNTER — Ambulatory Visit (HOSPITAL_BASED_OUTPATIENT_CLINIC_OR_DEPARTMENT_OTHER): Payer: Medicaid Other

## 2020-06-15 ENCOUNTER — Other Ambulatory Visit: Payer: Self-pay

## 2020-06-15 DIAGNOSIS — O99213 Obesity complicating pregnancy, third trimester: Secondary | ICD-10-CM | POA: Diagnosis not present

## 2020-06-15 DIAGNOSIS — E669 Obesity, unspecified: Secondary | ICD-10-CM | POA: Diagnosis not present

## 2020-06-15 DIAGNOSIS — Z362 Encounter for other antenatal screening follow-up: Secondary | ICD-10-CM

## 2020-06-15 DIAGNOSIS — O368390 Maternal care for abnormalities of the fetal heart rate or rhythm, unspecified trimester, not applicable or unspecified: Secondary | ICD-10-CM

## 2020-06-15 DIAGNOSIS — O285 Abnormal chromosomal and genetic finding on antenatal screening of mother: Secondary | ICD-10-CM

## 2020-06-15 DIAGNOSIS — O368330 Maternal care for abnormalities of the fetal heart rate or rhythm, third trimester, not applicable or unspecified: Secondary | ICD-10-CM | POA: Diagnosis present

## 2020-06-15 DIAGNOSIS — Z3A30 30 weeks gestation of pregnancy: Secondary | ICD-10-CM

## 2020-06-15 DIAGNOSIS — Z148 Genetic carrier of other disease: Secondary | ICD-10-CM | POA: Diagnosis not present

## 2020-06-21 ENCOUNTER — Encounter: Payer: Self-pay | Admitting: Obstetrics and Gynecology

## 2020-06-21 ENCOUNTER — Other Ambulatory Visit: Payer: Self-pay

## 2020-06-21 ENCOUNTER — Ambulatory Visit (INDEPENDENT_AMBULATORY_CARE_PROVIDER_SITE_OTHER): Payer: Medicaid Other | Admitting: Obstetrics and Gynecology

## 2020-06-21 ENCOUNTER — Other Ambulatory Visit: Payer: Medicaid Other

## 2020-06-21 VITALS — BP 104/66 | HR 119 | Wt 211.0 lb

## 2020-06-21 DIAGNOSIS — Z3A30 30 weeks gestation of pregnancy: Secondary | ICD-10-CM

## 2020-06-21 DIAGNOSIS — Z7189 Other specified counseling: Secondary | ICD-10-CM

## 2020-06-21 DIAGNOSIS — Z23 Encounter for immunization: Secondary | ICD-10-CM | POA: Diagnosis not present

## 2020-06-21 DIAGNOSIS — O285 Abnormal chromosomal and genetic finding on antenatal screening of mother: Secondary | ICD-10-CM

## 2020-06-21 DIAGNOSIS — Z148 Genetic carrier of other disease: Secondary | ICD-10-CM | POA: Diagnosis not present

## 2020-06-21 DIAGNOSIS — O368390 Maternal care for abnormalities of the fetal heart rate or rhythm, unspecified trimester, not applicable or unspecified: Secondary | ICD-10-CM | POA: Insufficient documentation

## 2020-06-21 DIAGNOSIS — Z3493 Encounter for supervision of normal pregnancy, unspecified, third trimester: Secondary | ICD-10-CM

## 2020-06-21 DIAGNOSIS — Z3490 Encounter for supervision of normal pregnancy, unspecified, unspecified trimester: Secondary | ICD-10-CM

## 2020-06-21 NOTE — Progress Notes (Signed)
   PRENATAL VISIT NOTE  Subjective:  Brianna Dawson is a 24 y.o. G3P2002 at [redacted]w[redacted]d being seen today for ongoing prenatal care.  She is currently monitored for the following issues for this high-risk pregnancy and has Supervision of low-risk pregnancy; Abnormal genetic test in pregnancy; and Fetal bradycardia, antepartum condition or complication on their problem list.  Patient reports no complaints.  Contractions: Not present. Vag. Bleeding: None.  Movement: Present. Denies leaking of fluid.   The following portions of the patient's history were reviewed and updated as appropriate: allergies, current medications, past family history, past medical history, past social history, past surgical history and problem list.   Objective:   Vitals:   06/21/20 0823  BP: 104/66  Pulse: (!) 119  Weight: 211 lb (95.7 kg)    Fetal Status:     Movement: Present     General:  Alert, oriented and cooperative. Patient is in no acute distress.  Skin: Skin is warm and dry. No rash noted.   Cardiovascular: Normal heart rate noted  Respiratory: Normal respiratory effort, no problems with respiration noted  Abdomen: Soft, gravid, appropriate for gestational age.  Pain/Pressure: Absent     Pelvic: Cervical exam deferred        Extremities: Normal range of motion.  Edema: None  Mental Status: Normal mood and affect. Normal behavior. Normal judgment and thought content.   Assessment and Plan:  Pregnancy: G3P2002 at [redacted]w[redacted]d  1. Encounter for supervision of low-risk pregnancy in third trimester  2. 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 Fetal bradycardia, antepartum condition or complication Resolved on subsequent [place] Neg SSA/SSA antibodies  4. [redacted] weeks gestation of pregnancy  5. Counseled about COVID-19 virus infection The patient was counseled on the potential benefits and lack of known risks of COVID vaccination, during pregnancy and breastfeeding, on today's visit. The patient's  questions and concerns were addressed today, including none. The patient is planning to get vaccinated.   Preterm labor symptoms and general obstetric precautions including but not limited to vaginal bleeding, contractions, leaking of fluid and fetal movement were reviewed in detail with the patient. Please refer to After Visit Summary for other counseling recommendations.   Return in about 2 weeks (around 07/05/2020) for high OB, in person.  Future Appointments  Date Time Provider Department Center  07/27/2020 12:30 PM [facility] NURSE [facility]  07/27/2020 12:45 PM [facility]    Conan Bowens, MD

## 2020-06-22 LAB — GLUCOSE TOLERANCE, 2 HOURS W/ 1HR
Glucose, 1 hour: 120 mg/dL (ref 65–179)
Glucose, 2 hour: 90 mg/dL (ref 65–152)
Glucose, Fasting: 81 mg/dL (ref 65–91)

## 2020-06-22 LAB — CBC
Hematocrit: 34.4 % (ref 34.0–46.6)
Hemoglobin: 11 g/dL — ABNORMAL LOW (ref 11.1–15.9)
MCH: 24.1 pg — ABNORMAL LOW (ref 26.6–33.0)
MCHC: 32 g/dL (ref 31.5–35.7)
MCV: 75 fL — ABNORMAL LOW (ref 79–97)
Platelets: 205 10*3/uL (ref 150–450)
RBC: 4.57 x10E6/uL (ref 3.77–5.28)
RDW: 14.4 % (ref 11.7–15.4)
WBC: 9.5 10*3/uL (ref 3.4–10.8)

## 2020-06-22 LAB — HIV ANTIBODY (ROUTINE TESTING W REFLEX): HIV Screen 4th Generation wRfx: NONREACTIVE

## 2020-06-22 LAB — RPR: RPR Ser Ql: NONREACTIVE

## 2020-07-04 ENCOUNTER — Other Ambulatory Visit: Payer: Self-pay | Admitting: Family Medicine

## 2020-07-04 DIAGNOSIS — N898 Other specified noninflammatory disorders of vagina: Secondary | ICD-10-CM

## 2020-07-04 DIAGNOSIS — B379 Candidiasis, unspecified: Secondary | ICD-10-CM

## 2020-07-06 ENCOUNTER — Encounter: Payer: Medicaid Other | Admitting: Obstetrics and Gynecology

## 2020-07-12 ENCOUNTER — Encounter (HOSPITAL_COMMUNITY): Payer: Self-pay | Admitting: Obstetrics and Gynecology

## 2020-07-12 ENCOUNTER — Telehealth: Payer: Self-pay | Admitting: *Deleted

## 2020-07-12 ENCOUNTER — Inpatient Hospital Stay (HOSPITAL_COMMUNITY)
Admission: AD | Admit: 2020-07-12 | Discharge: 2020-07-12 | Disposition: A | Discharge status: Home / Self Care | Payer: Medicaid Other | Attending: Obstetrics & Gynecology | Admitting: Obstetrics & Gynecology

## 2020-07-12 ENCOUNTER — Ambulatory Visit: Payer: Self-pay | Admitting: *Deleted

## 2020-07-12 ENCOUNTER — Other Ambulatory Visit: Payer: Self-pay

## 2020-07-12 DIAGNOSIS — Z3A33 33 weeks gestation of pregnancy: Secondary | ICD-10-CM | POA: Diagnosis not present

## 2020-07-12 DIAGNOSIS — O26893 Other specified pregnancy related conditions, third trimester: Secondary | ICD-10-CM | POA: Diagnosis not present

## 2020-07-12 DIAGNOSIS — R109 Unspecified abdominal pain: Secondary | ICD-10-CM | POA: Diagnosis present

## 2020-07-12 DIAGNOSIS — Z3493 Encounter for supervision of normal pregnancy, unspecified, third trimester: Secondary | ICD-10-CM

## 2020-07-12 DIAGNOSIS — O212 Late vomiting of pregnancy: Secondary | ICD-10-CM | POA: Diagnosis not present

## 2020-07-12 DIAGNOSIS — O219 Vomiting of pregnancy, unspecified: Secondary | ICD-10-CM

## 2020-07-12 DIAGNOSIS — Z87891 Personal history of nicotine dependence: Secondary | ICD-10-CM | POA: Diagnosis not present

## 2020-07-12 DIAGNOSIS — O285 Abnormal chromosomal and genetic finding on antenatal screening of mother: Secondary | ICD-10-CM

## 2020-07-12 LAB — URINALYSIS, ROUTINE W REFLEX MICROSCOPIC
Bilirubin Urine: NEGATIVE
Glucose, UA: NEGATIVE mg/dL
Hgb urine dipstick: NEGATIVE
Ketones, ur: 5 mg/dL — AB
Leukocytes,Ua: NEGATIVE
Nitrite: NEGATIVE
Protein, ur: 100 mg/dL — AB
Specific Gravity, Urine: 1.033 — ABNORMAL HIGH (ref 1.005–1.030)
pH: 6 (ref 5.0–8.0)

## 2020-07-12 LAB — CBC
HCT: 36 % (ref 36.0–46.0)
Hemoglobin: 10.8 g/dL — ABNORMAL LOW (ref 12.0–15.0)
MCH: 22.8 pg — ABNORMAL LOW (ref 26.0–34.0)
MCHC: 30 g/dL (ref 30.0–36.0)
MCV: 75.9 fL — ABNORMAL LOW (ref 80.0–100.0)
Platelets: 160 10*3/uL (ref 150–400)
RBC: 4.74 MIL/uL (ref 3.87–5.11)
RDW: 14.6 % (ref 11.5–15.5)
WBC: 7.7 10*3/uL (ref 4.0–10.5)
nRBC: 0 % (ref 0.0–0.2)

## 2020-07-12 LAB — COMPREHENSIVE METABOLIC PANEL
ALT: 13 U/L (ref 0–44)
AST: 18 U/L (ref 15–41)
Albumin: 2.9 g/dL — ABNORMAL LOW (ref 3.5–5.0)
Alkaline Phosphatase: 94 U/L (ref 38–126)
Anion gap: 10 (ref 5–15)
BUN: 7 mg/dL (ref 6–20)
CO2: 22 mmol/L (ref 22–32)
Calcium: 8.7 mg/dL — ABNORMAL LOW (ref 8.9–10.3)
Chloride: 102 mmol/L (ref 98–111)
Creatinine, Ser: 0.55 mg/dL (ref 0.44–1.00)
GFR calc Af Amer: >60 mL/min (ref >60)
GFR calc non Af Amer: >60 mL/min (ref >60)
Glucose, Bld: 88 mg/dL (ref 70–99)
Potassium: 3.8 mmol/L (ref 3.5–5.1)
Sodium: 134 mmol/L — ABNORMAL LOW (ref 135–145)
Total Bilirubin: 0.6 mg/dL (ref 0.3–1.2)
Total Protein: 6.1 g/dL — ABNORMAL LOW (ref 6.5–8.1)

## 2020-07-12 LAB — LIPASE, BLOOD: Lipase: 21 U/L (ref 11–51)

## 2020-07-12 MED ORDER — ALUM & MAG HYDROXIDE-SIMETH 200-200-20 MG/5ML PO SUSP
30.0000 mL | Freq: Once | ORAL | Status: AC
  Administered 2020-07-12: 30 mL via ORAL
  Filled 2020-07-12: qty 30

## 2020-07-12 MED ORDER — LIDOCAINE VISCOUS HCL 2 % MT SOLN
15.0000 mL | Freq: Once | OROMUCOSAL | Status: AC
  Administered 2020-07-12: 15 mL via ORAL
  Filled 2020-07-12: qty 15

## 2020-07-12 NOTE — Telephone Encounter (Signed)
Message was left by pt on nurse voicemail today @ 1000 and listened to by this RN @ 1101. She stated that she has been having contractions since 2 am this morning and also vomiting. She wants to know what to do. Per chart review, pt placed another call @ 1020 which was received by Elby Beck, RN @ [facility]. During that call pt was advised to go to the hospital for evaluation.

## 2020-07-12 NOTE — Telephone Encounter (Signed)
Patient is calling community line- she states that she has been having contractions since 2 am this morning- she was not able to get appointment at OB/GYN- advised L&D/[place] per protocol  Reason for Disposition . Contractions < 10 minutes apart for 1 hour (i.e., 6 or more contractions an hour)  Answer Assessment - Initial Assessment Questions 1. ONSET: "When did the symptoms begin?"        2 am this morning- patient had chicken last night- she had vomiting and then she started having contractions. 2. CONTRACTIONS: "Describe the contractions that you are having." (e.g., duration, frequency, regularity, severity)   Started 2 am,  5-6 minutes apart, patient is hydrating, seeming to become irregular, pain-6 3. EDD: "What date are you expecting to deliver?"     08/24/20-33.6 wks 4. PARITY: "Have you had a baby before?" If Yes, ask: "How long did the labor last?"     Yes- 2 children, 1 hour labor 5. FETAL MOVEMENT: "Has the baby's movement decreased or changed significantly from normal?"     Baby is moving- not able to focus on movement because of pain 6. OTHER SYMPTOMS: "Do you have any other symptoms?" (e.g., leaking fluid from vagina, fever, hand/facial swelling)     no  Protocols used: PREGNANCY - LABOR - PRETERM-A-AH

## 2020-07-12 NOTE — MAU Provider Note (Signed)
History     Patient Active Problem List   Diagnosis Date Noted  . Fetal bradycardia, antepartum condition or complication 24/07/7352  . Abnormal genetic test in pregnancy 04/04/2020  . Supervision of low-risk pregnancy 02/22/2020    Chief Complaint  Patient presents with  . Contractions  . Emesis   Brianna Dawson is a 24 y.o. G3P2002 at 96w6dwho presents to MAU after 6hrs of vomiting overnight with contractions q2-382m overnight that have eased to q5-26m72m  Denies pelvic pain or cramping, LOF, or VB. Still somewhat nauseated, but no further emesis since 8am and no diarrhea.   Emesis  This is a new problem. The current episode started today. The problem occurs 5 to 10 times per day. The problem has been resolved. There has been no fever. Associated symptoms include abdominal pain (epigastric). She has tried nothing for the symptoms.  Abdominal Pain This is a new problem. The current episode started today. The onset quality is sudden. The problem occurs intermittently. The problem has been unchanged. The pain is located in the epigastric region. The pain is at a severity of 5/10. The pain is moderate. The quality of the pain is cramping. The abdominal pain does not radiate. Associated symptoms include vomiting. The pain is aggravated by vomiting. The pain is relieved by nothing. She has tried nothing for the symptoms.    OB History    Gravida  3   Para  2   Term  2   Preterm  0   AB  0   Living  2     SAB  0   TAB  0   Ectopic  0   Multiple  0   Live Births  2           Past Medical History:  Diagnosis Date  . Medical history non-contributory     Past Surgical History:  Procedure Laterality Date  . NO PAST SURGERIES      Family History  Problem Relation Age of Onset  . Hypertension Maternal Grandfather     Social History   Tobacco Use  . Smoking status: Former Smoker    Types: Cigars    Quit date: 01/22/2020    Years since quitting: 0.4  .  Smokeless tobacco: Never Used  . Tobacco comment: 2 cigars a day  Vaping Use  . Vaping Use: Never used  Substance Use Topics  . Alcohol use: Not Currently    Comment: socially, occasionally  . Drug use: Not Currently    Types: Marijuana    Comment: last time several years ago    Allergies: No Known Allergies  Medications Prior to Admission  Medication Sig Dispense Refill Last Dose  . Prenatal Vit-Fe Fumarate-FA (MULTIVITAMIN-PRENATAL) 27-0.8 MG TABS tablet Take 1 tablet by mouth daily at 12 noon. 30 tablet 9 Past Month at Unknown time  . albuterol (VENTOLIN HFA) 108 (90 Base) MCG/ACT inhaler Inhale 2 puffs into the lungs every 4 (four) hours as needed for wheezing or shortness of breath. 18 g 0 More than a month at Unknown time  . Blood Pressure Monitoring (BLOOD PRESSURE KIT) DEVI 1 Device by Does not apply route as needed. 1 each 0 More than a month at Unknown time    Review of Systems  Gastrointestinal: Positive for abdominal pain (epigastric) and vomiting.  All other systems reviewed and are negative.   See HPI Above Physical Exam   Blood pressure 109/68, pulse (!) 133, temperature 98.7 F (37.1 C),  temperature source Oral, resp. rate 18, height 5' 6" (1.676 m), weight 212 lb 6.4 oz (96.3 kg), last menstrual period 11/18/2019, SpO2 100 %, unknown if currently breastfeeding.  Results for orders placed or performed during the hospital encounter of 07/12/20 (from the past 24 hour(s))  Urinalysis, Routine w reflex microscopic Urine, Clean Catch     Status: Abnormal   Collection Time: 07/12/20 12:14 PM  Result Value Ref Range   Color, Urine AMBER (A) YELLOW   APPearance HAZY (A) CLEAR   Specific Gravity, Urine 1.033 (H) 1.005 - 1.030   pH 6.0 5.0 - 8.0   Glucose, UA NEGATIVE NEGATIVE mg/dL   Hgb urine dipstick NEGATIVE NEGATIVE   Bilirubin Urine NEGATIVE NEGATIVE   Ketones, ur 5 (A) NEGATIVE mg/dL   Protein, ur 100 (A) NEGATIVE mg/dL   Nitrite NEGATIVE NEGATIVE    Leukocytes,Ua NEGATIVE NEGATIVE   RBC / HPF 0-5 0 - 5 RBC/hpf   WBC, UA 0-5 0 - 5 WBC/hpf   Bacteria, UA FEW (A) NONE SEEN   Squamous Epithelial / LPF 11-20 0 - 5   Mucus PRESENT     Physical Exam Constitutional:      Appearance: Normal appearance. She is normal weight.  HENT:     Nose: Nose normal.     Mouth/Throat:     Mouth: Mucous membranes are dry.  Eyes:     Extraocular Movements: Extraocular movements intact.     Pupils: Pupils are equal, round, and reactive to light.  Cardiovascular:     Rate and Rhythm: Normal rate.     Pulses: Normal pulses.  Pulmonary:     Effort: Pulmonary effort is normal.  Abdominal:     General: Bowel sounds are normal.     Comments: Initial complaint was for contractions q2-18mn overnight and q5-652m. Had a "ctx" during my assessment, but entire uterus was soft and pt was able to verbalize that her pain was entirely epigastric and not pelvic/pressure/tightening at all.   Genitourinary:    General: Normal vulva.     Comments: Dilation: Fingertip (externally, closed internally) Effacement (%): Thick Exam by:: JaSamella ParrCNM  Skin:    General: Skin is warm and dry.     Capillary Refill: Capillary refill takes less than 2 seconds.  Neurological:     Mental Status: She is alert and oriented to person, place, and time.  Psychiatric:        Mood and Affect: Mood normal.        Behavior: Behavior normal.        Thought Content: Thought content normal.        Judgment: Judgment normal.     Comments: Pt became very anxious when I asked to do a cervical exam. Tolerated it well but was upset enough that I asked if she has a history of SA. She denied but said "I've just had a lot of female providers and their checks are usually so uncomfortable and always make me cramp." Suggest female providers that use trauma-informed techniques for the remainder of her pregnancy/labor.    MAU Course & MDM   GI cocktail - relieved epigastric pain completely  Results  for orders placed or performed during the hospital encounter of 07/12/20 (from the past 24 hour(s))  Urinalysis, Routine w reflex microscopic Urine, Clean Catch     Status: Abnormal   Collection Time: 07/12/20 12:14 PM  Result Value Ref Range   Color, Urine AMBER (A) YELLOW   APPearance HAZY (A) CLEAR  Specific Gravity, Urine 1.033 (H) 1.005 - 1.030   pH 6.0 5.0 - 8.0   Glucose, UA NEGATIVE NEGATIVE mg/dL   Hgb urine dipstick NEGATIVE NEGATIVE   Bilirubin Urine NEGATIVE NEGATIVE   Ketones, ur 5 (A) NEGATIVE mg/dL   Protein, ur 100 (A) NEGATIVE mg/dL   Nitrite NEGATIVE NEGATIVE   Leukocytes,Ua NEGATIVE NEGATIVE   RBC / HPF 0-5 0 - 5 RBC/hpf   WBC, UA 0-5 0 - 5 WBC/hpf   Bacteria, UA FEW (A) NONE SEEN   Squamous Epithelial / LPF 11-20 0 - 5   Mucus PRESENT   CBC     Status: Abnormal   Collection Time: 07/12/20  1:08 PM  Result Value Ref Range   WBC 7.7 4.0 - 10.5 K/uL   RBC 4.74 3.87 - 5.11 MIL/uL   Hemoglobin 10.8 (L) 12.0 - 15.0 g/dL   HCT 36.0 36 - 46 %   MCV 75.9 (L) 80.0 - 100.0 fL   MCH 22.8 (L) 26.0 - 34.0 pg   MCHC 30.0 30.0 - 36.0 g/dL   RDW 14.6 11.5 - 15.5 %   Platelets 160 150 - 400 K/uL   nRBC 0.0 0.0 - 0.2 %  Comprehensive metabolic panel     Status: Abnormal   Collection Time: 07/12/20  1:08 PM  Result Value Ref Range   Sodium 134 (L) 135 - 145 mmol/L   Potassium 3.8 3.5 - 5.1 mmol/L   Chloride 102 98 - 111 mmol/L   CO2 22 22 - 32 mmol/L   Glucose, Bld 88 70 - 99 mg/dL   BUN 7 6 - 20 mg/dL   Creatinine, Ser 0.55 0.44 - 1.00 mg/dL   Calcium 8.7 (L) 8.9 - 10.3 mg/dL   Total Protein 6.1 (L) 6.5 - 8.1 g/dL   Albumin 2.9 (L) 3.5 - 5.0 g/dL   AST 18 15 - 41 U/L   ALT 13 0 - 44 U/L   Alkaline Phosphatase 94 38 - 126 U/L   Total Bilirubin 0.6 0.3 - 1.2 mg/dL   GFR calc non Af Amer >60 >60 mL/min   GFR calc Af Amer >60 >60 mL/min   Anion gap 10 5 - 15  Lipase, blood     Status: None   Collection Time: 07/12/20  1:08 PM  Result Value Ref Range   Lipase 21  11 - 51 U/L   Assessment: Stomach cramping related to repeated emesis  Plan: - Discharge home in stable condition - Follow up at [facility] as scheduled - Preterm labor precautions given   Gaylan Gerold, CNM, MSN, [facility], [place] 07/12/20 1:31 PM

## 2020-07-12 NOTE — MAU Note (Addendum)
Presents with c/o havinfgctxs every 2-3 minutes @ 0200 but now 6 minutes apart.  Denies VB or LOF.  Denies recent intercourse.  Endorses +FM.  Also reports has vomited approx 6x in 24 hours.

## 2020-07-13 LAB — CULTURE, OB URINE: Culture: <10000 — AB

## 2020-07-23 ENCOUNTER — Encounter: Payer: Medicaid Other | Admitting: Obstetrics and Gynecology

## 2020-07-27 ENCOUNTER — Encounter (HOSPITAL_COMMUNITY): Payer: Self-pay | Admitting: *Deleted

## 2020-07-27 ENCOUNTER — Other Ambulatory Visit: Payer: Self-pay | Admitting: Obstetrics and Gynecology

## 2020-07-27 ENCOUNTER — Ambulatory Visit: Payer: Medicaid Other | Admitting: *Deleted

## 2020-07-27 ENCOUNTER — Other Ambulatory Visit: Payer: Self-pay | Admitting: Advanced Practice Midwife

## 2020-07-27 ENCOUNTER — Other Ambulatory Visit: Payer: Self-pay

## 2020-07-27 ENCOUNTER — Telehealth (HOSPITAL_COMMUNITY): Payer: Self-pay | Admitting: *Deleted

## 2020-07-27 ENCOUNTER — Ambulatory Visit: Payer: Medicaid Other | Attending: Obstetrics and Gynecology

## 2020-07-27 DIAGNOSIS — Z3493 Encounter for supervision of normal pregnancy, unspecified, third trimester: Secondary | ICD-10-CM | POA: Diagnosis present

## 2020-07-27 DIAGNOSIS — O365990 Maternal care for other known or suspected poor fetal growth, unspecified trimester, not applicable or unspecified: Secondary | ICD-10-CM | POA: Diagnosis present

## 2020-07-27 DIAGNOSIS — O99213 Obesity complicating pregnancy, third trimester: Secondary | ICD-10-CM | POA: Diagnosis not present

## 2020-07-27 DIAGNOSIS — Z362 Encounter for other antenatal screening follow-up: Secondary | ICD-10-CM

## 2020-07-27 DIAGNOSIS — O365930 Maternal care for other known or suspected poor fetal growth, third trimester, not applicable or unspecified: Secondary | ICD-10-CM

## 2020-07-27 DIAGNOSIS — O285 Abnormal chromosomal and genetic finding on antenatal screening of mother: Secondary | ICD-10-CM

## 2020-07-27 DIAGNOSIS — Z148 Genetic carrier of other disease: Secondary | ICD-10-CM

## 2020-07-27 DIAGNOSIS — E669 Obesity, unspecified: Secondary | ICD-10-CM | POA: Diagnosis not present

## 2020-07-27 DIAGNOSIS — O289 Unspecified abnormal findings on antenatal screening of mother: Secondary | ICD-10-CM

## 2020-07-27 DIAGNOSIS — Z3A36 36 weeks gestation of pregnancy: Secondary | ICD-10-CM

## 2020-07-27 NOTE — Procedures (Signed)
Brianna Dawson 08-01-1996 [redacted]w[redacted]d  Fetus A Non-Stress Test Interpretation for 07/27/20  Indication: IUGR  Fetal Heart Rate A Mode: External Baseline Rate (A): 135 bpm Variability: Moderate Accelerations: 15 x 15 Decelerations: None Multiple birth?: No  Uterine Activity Mode: Palpation, Toco Contraction Frequency (min): U/I Contraction Duration (sec): 40-50 Contraction Quality: Mild Resting Tone Palpated: Relaxed Resting Time: Adequate  Interpretation (Fetal Testing) Nonstress Test Interpretation: Reactive Comments: reviewed tracing with Dr. Judeth Cornfield

## 2020-07-27 NOTE — Telephone Encounter (Signed)
Preadmission screen  

## 2020-08-01 ENCOUNTER — Encounter: Payer: Self-pay | Admitting: Family Medicine

## 2020-08-01 ENCOUNTER — Other Ambulatory Visit (HOSPITAL_COMMUNITY)
Admission: RE | Admit: 2020-08-01 | Discharge: 2020-08-01 | Disposition: A | Discharge status: Home / Self Care | Payer: Medicaid Other | Source: Ambulatory Visit | Attending: Family Medicine | Admitting: Family Medicine

## 2020-08-01 DIAGNOSIS — Z01812 Encounter for preprocedural laboratory examination: Secondary | ICD-10-CM | POA: Insufficient documentation

## 2020-08-01 DIAGNOSIS — Z20822 Contact with and (suspected) exposure to covid-19: Secondary | ICD-10-CM | POA: Insufficient documentation

## 2020-08-01 LAB — SARS CORONAVIRUS 2 (TAT 6-24 HRS): SARS Coronavirus 2: NEGATIVE

## 2020-08-02 ENCOUNTER — Encounter: Payer: Self-pay | Admitting: Family Medicine

## 2020-08-03 ENCOUNTER — Inpatient Hospital Stay (HOSPITAL_COMMUNITY): Payer: Medicaid Other | Admitting: Anesthesiology

## 2020-08-03 ENCOUNTER — Inpatient Hospital Stay (HOSPITAL_COMMUNITY): Payer: Medicaid Other

## 2020-08-03 ENCOUNTER — Other Ambulatory Visit: Payer: Self-pay

## 2020-08-03 ENCOUNTER — Inpatient Hospital Stay (HOSPITAL_COMMUNITY)
Admission: AD | Admit: 2020-08-03 | Payer: Medicaid Other | Source: Home / Self Care | Admitting: Obstetrics & Gynecology

## 2020-08-03 ENCOUNTER — Inpatient Hospital Stay (HOSPITAL_COMMUNITY)
Admission: AD | Admit: 2020-08-03 | Discharge: 2020-08-05 | DRG: 807 | Disposition: A | Discharge status: Home / Self Care | Payer: Medicaid Other | Attending: Obstetrics and Gynecology | Admitting: Obstetrics and Gynecology

## 2020-08-03 ENCOUNTER — Encounter (HOSPITAL_COMMUNITY): Payer: Self-pay | Admitting: Obstetrics and Gynecology

## 2020-08-03 DIAGNOSIS — O368390 Maternal care for abnormalities of the fetal heart rate or rhythm, unspecified trimester, not applicable or unspecified: Secondary | ICD-10-CM | POA: Diagnosis present

## 2020-08-03 DIAGNOSIS — Z30430 Encounter for insertion of intrauterine contraceptive device: Secondary | ICD-10-CM

## 2020-08-03 DIAGNOSIS — O365930 Maternal care for other known or suspected poor fetal growth, third trimester, not applicable or unspecified: Principal | ICD-10-CM | POA: Diagnosis present

## 2020-08-03 DIAGNOSIS — Z3493 Encounter for supervision of normal pregnancy, unspecified, third trimester: Secondary | ICD-10-CM

## 2020-08-03 DIAGNOSIS — Z3A37 37 weeks gestation of pregnancy: Secondary | ICD-10-CM

## 2020-08-03 DIAGNOSIS — O285 Abnormal chromosomal and genetic finding on antenatal screening of mother: Secondary | ICD-10-CM | POA: Diagnosis present

## 2020-08-03 DIAGNOSIS — Z87891 Personal history of nicotine dependence: Secondary | ICD-10-CM

## 2020-08-03 DIAGNOSIS — O365990 Maternal care for other known or suspected poor fetal growth, unspecified trimester, not applicable or unspecified: Secondary | ICD-10-CM | POA: Diagnosis present

## 2020-08-03 LAB — CBC
HCT: 35.6 % — ABNORMAL LOW (ref 36.0–46.0)
Hemoglobin: 10.7 g/dL — ABNORMAL LOW (ref 12.0–15.0)
MCH: 22.9 pg — ABNORMAL LOW (ref 26.0–34.0)
MCHC: 30.1 g/dL (ref 30.0–36.0)
MCV: 76.2 fL — ABNORMAL LOW (ref 80.0–100.0)
Platelets: 176 10*3/uL (ref 150–400)
RBC: 4.67 MIL/uL (ref 3.87–5.11)
RDW: 14.8 % (ref 11.5–15.5)
WBC: 7.2 10*3/uL (ref 4.0–10.5)
nRBC: 0 % (ref 0.0–0.2)

## 2020-08-03 LAB — RPR: RPR Ser Ql: NONREACTIVE

## 2020-08-03 LAB — TYPE AND SCREEN
ABO/RH(D): O POS
Antibody Screen: NEGATIVE

## 2020-08-03 LAB — GROUP B STREP BY PCR: Group B strep by PCR: NEGATIVE

## 2020-08-03 MED ORDER — MISOPROSTOL 50MCG HALF TABLET
50.0000 ug | ORAL_TABLET | ORAL | Status: DC
  Administered 2020-08-03: 50 ug via ORAL

## 2020-08-03 MED ORDER — MISOPROSTOL 25 MCG QUARTER TABLET
25.0000 ug | ORAL_TABLET | ORAL | Status: DC | PRN
  Administered 2020-08-03: 25 ug via VAGINAL
  Filled 2020-08-03: qty 1

## 2020-08-03 MED ORDER — MISOPROSTOL 50MCG HALF TABLET
ORAL_TABLET | ORAL | Status: AC
  Filled 2020-08-03: qty 1

## 2020-08-03 MED ORDER — LEVONORGESTREL 19.5 MCG/DAY IU IUD
INTRAUTERINE_SYSTEM | Freq: Once | INTRAUTERINE | Status: AC
  Administered 2020-08-03: 22:00:00 1 via INTRAUTERINE
  Filled 2020-08-03: qty 1

## 2020-08-03 MED ORDER — LACTATED RINGERS IV SOLN: INTRAVENOUS | Status: DC

## 2020-08-03 MED ORDER — OXYTOCIN BOLUS FROM INFUSION
333.0000 mL | Freq: Once | INTRAVENOUS | Status: AC
  Administered 2020-08-03: 333 mL via INTRAVENOUS

## 2020-08-03 MED ORDER — OXYCODONE-ACETAMINOPHEN 5-325 MG PO TABS: 2.0000 | ORAL_TABLET | ORAL | Status: DC | PRN

## 2020-08-03 MED ORDER — LIDOCAINE HCL (PF) 1 % IJ SOLN
INTRAMUSCULAR | Status: DC | PRN
  Administered 2020-08-03: 11 mL via EPIDURAL

## 2020-08-03 MED ORDER — SOD CITRATE-CITRIC ACID 500-334 MG/5ML PO SOLN: 30.0000 mL | ORAL | Status: DC | PRN

## 2020-08-03 MED ORDER — FENTANYL CITRATE (PF) 2500 MCG/50ML IJ SOLN
INTRAMUSCULAR | Status: DC | PRN
Start: 2020-08-03 — End: 2020-08-03
  Administered 2020-08-03: 12 mL/h via EPIDURAL

## 2020-08-03 MED ORDER — PHENYLEPHRINE 40 MCG/ML (10ML) SYRINGE FOR IV PUSH (FOR BLOOD PRESSURE SUPPORT): 80.0000 ug | PREFILLED_SYRINGE | INTRAVENOUS | Status: DC | PRN

## 2020-08-03 MED ORDER — LACTATED RINGERS IV SOLN
500.0000 mL | Freq: Once | INTRAVENOUS | Status: AC
  Administered 2020-08-03: 500 mL via INTRAVENOUS

## 2020-08-03 MED ORDER — OXYCODONE-ACETAMINOPHEN 5-325 MG PO TABS: 1.0000 | ORAL_TABLET | ORAL | Status: DC | PRN

## 2020-08-03 MED ORDER — FENTANYL CITRATE (PF) 100 MCG/2ML IJ SOLN
100.0000 ug | INTRAMUSCULAR | Status: DC | PRN
  Administered 2020-08-03: 100 ug via INTRAVENOUS
  Filled 2020-08-03: qty 2

## 2020-08-03 MED ORDER — TERBUTALINE SULFATE 1 MG/ML IJ SOLN: 0.2500 mg | Freq: Once | INTRAMUSCULAR | Status: DC | PRN

## 2020-08-03 MED ORDER — EPHEDRINE 5 MG/ML INJ: 10.0000 mg | INTRAVENOUS | Status: DC | PRN

## 2020-08-03 MED ORDER — DIPHENHYDRAMINE HCL 50 MG/ML IJ SOLN: 12.5000 mg | INTRAMUSCULAR | Status: DC | PRN

## 2020-08-03 MED ORDER — ACETAMINOPHEN 325 MG PO TABS: 650.0000 mg | ORAL_TABLET | ORAL | Status: DC | PRN

## 2020-08-03 MED ORDER — LIDOCAINE HCL (PF) 1 % IJ SOLN: 30.0000 mL | INTRAMUSCULAR | Status: DC | PRN

## 2020-08-03 MED ORDER — OXYTOCIN-SODIUM CHLORIDE 30-0.9 UT/500ML-% IV SOLN
2.5000 [IU]/h | INTRAVENOUS | Status: DC
  Administered 2020-08-03: 2.5 [IU]/h via INTRAVENOUS

## 2020-08-03 MED ORDER — OXYTOCIN-SODIUM CHLORIDE 30-0.9 UT/500ML-% IV SOLN
1.0000 m[IU]/min | INTRAVENOUS | Status: DC
  Filled 2020-08-03: qty 500

## 2020-08-03 MED ORDER — FENTANYL-BUPIVACAINE-NACL 0.5-0.125-0.9 MG/250ML-% EP SOLN
12.0000 mL/h | EPIDURAL | Status: DC | PRN
  Filled 2020-08-03: qty 250

## 2020-08-03 MED ORDER — ONDANSETRON HCL 4 MG/2ML IJ SOLN: 4.0000 mg | Freq: Four times a day (QID) | INTRAMUSCULAR | Status: DC | PRN

## 2020-08-03 MED ORDER — LACTATED RINGERS IV SOLN
500.0000 mL | INTRAVENOUS | Status: DC | PRN
  Administered 2020-08-03 (×2): 500 mL via INTRAVENOUS

## 2020-08-03 NOTE — H&P (Signed)
Brianna Dawson is a 24 y.o. female G3P2002 @ 37.0wks by LMP c/w 19wk scan presenting for IOL due to IUGR (EFW 5th%) with increased S/D ratio.  Denies H/A,  N/V, ctx, leaking, or bldg. Her preg has been followed by the [facility] service and has been remarkable for:  # fetal bradycardia on anatomy scan (nl fetal echo, normal since) # FGR/increased dopplers  OB History    Gravida  3   Para  2   Term  2   Preterm  0   AB  0   Living  2     SAB  0   TAB  0   Ectopic  0   Multiple  0   Live Births  2          Past Medical History:  Diagnosis Date  . Medical history non-contributory    Past Surgical History:  Procedure Laterality Date  . NO PAST SURGERIES     Family History: family history includes Hypertension in her maternal grandfather. Social History:  reports that she quit smoking about 6 months ago. Her smoking use included cigars. She has never used smokeless tobacco. She reports previous alcohol use. She reports previous drug use. Drug: Marijuana.     Maternal Diabetes: No Genetic Screening: Normal Maternal Ultrasounds/Referrals: IUGR & increased S/D ratio Fetal Ultrasounds or other Referrals:  Fetal echo- nl (for fetal brady at anatomy scan) Maternal Substance Abuse:  No Significant Maternal Medications:  None Significant Maternal Lab Results:  Group B Strep negative Other Comments:  None  Review of Systems History Dilation: 1 Effacement (%): 50 Station: -3 Exam by:: K Umer Harig CNM Blood pressure 113/62, pulse 79, temperature 98.4 F (36.9 C), temperature source Oral, resp. rate 16, height 5\' 7"  (1.702 m), weight 98.4 kg, last menstrual period 11/18/2019, unknown if currently breastfeeding. Maternal Exam:  Introitus: Normal vulva.   Physical Exam Constitutional:      Appearance: Normal appearance.  HENT:     Head: Normocephalic.     Mouth/Throat:     Mouth: Mucous membranes are moist.  Cardiovascular:     Rate and Rhythm: Normal rate.  Pulmonary:      Effort: Pulmonary effort is normal.  Abdominal:     Comments: EFM 130s, +accels, no decels, occ variables Irreg 2-5 mins  Genitourinary:    General: Normal vulva.  Musculoskeletal:        General: Normal range of motion.     Cervical back: Normal range of motion.  Skin:    General: Skin is warm and dry.  Neurological:     Mental Status: She is alert.  Psychiatric:        Mood and Affect: Mood normal.     Prenatal labs: ABO, Rh: --/--/O POS (10/01 10-17-1988) Antibody: NEG (10/01 10-17-1988) Rubella: 11.70 (04/28 0957) RPR: Non Reactive (08/19 0831)  HBsAg: Negative (04/28 0957)  HIV: Non Reactive (08/19 0831)  GBS: NEGATIVE/-- (10/01 0919)   Assessment/Plan: IUP@37 .0wks FGR/increased doppers Cx unfavorable  -Admit to Labor and Delivery -Plan cervical ripening to start: cytotec given and cervical foley placed and inflated with 60cc fluid -Continue cytotec as long as foley is in place, then plan for Pit and/or when foley comes out  -Anticipate vag del  10-17-1988 CNM 08/03/2020, 10:57 AM

## 2020-08-03 NOTE — Discharge Summary (Signed)
Postpartum Discharge Summary  Date of Service updated 08/05/20     Patient Name: Brianna Dawson DOB: 11-Dec-1995 MRN: [id number]  Date of admission: 08/03/2020 Delivery date:08/03/2020  Delivering provider: Zettie Cooley E  Date of discharge: 08/05/2020  Admitting diagnosis: Pregnancy affected by fetal growth restriction [O36.5990] Intrauterine pregnancy: [redacted]w[redacted]d    Secondary diagnosis:  Active Problems:   Abnormal genetic test in pregnancy   Fetal bradycardia, antepartum condition or complication   Pregnancy affected by fetal growth restriction  Additional problems: none    Discharge diagnosis: Term Pregnancy Delivered                                              Post partum procedures:none Augmentation: Cytotec and IP Foley Complications: None  Hospital course: Induction of Labor With Vaginal Delivery   24y.o. yo G3P2002 at 3110w0das admitted to the hospital 08/03/2020 for induction of labor.  Indication for induction: FGR with increased dopplers (EFW 5th% with inc S/D ratio).  Patient had a labor course re remarkable for: receiving a cervical foley and two doses of cytotec, followed by SROM and progression to vag del without complication. Membrane Rupture Time/Date: 9:42 PM ,08/03/2020   Delivery Method:Vaginal, Spontaneous  Episiotomy: None  Lacerations:  Periurethral;Vaginal (hemostatic, not repaired) Details of delivery can be found in separate delivery note. She also requested a postplacental Liletta placement (see Procedure note).  Patient had a routine postpartum course. Patient is discharged home 08/05/20.  Newborn Data: Birth date:08/03/2020  Birth time:9:59 PM  Gender:Female  Living status:Living  Apgars:8 ,9  Weight:2605 g  2605gm (5lb 11.9oz)  Magnesium Sulfate received: No BMZ received: No Rhophylac:N/A MMR:N/A T-DaP:not given prenatally Flu: No Transfusion:No  Physical exam  Vitals:   08/04/20 0944 08/04/20 1403 08/04/20 2128 08/05/20 0523  BP: 119/60  122/70 121/81 127/76  Pulse: 85 81 76 75  Resp: 18 18 16 18   Temp: 98.3 F (36.8 C) 98.6 F (37 C) 98.2 F (36.8 C) 97.7 F (36.5 C)  TempSrc: Oral Oral Oral Oral  SpO2:   100% 100%  Weight:      Height:       General: alert, cooperative and no distress Lochia: appropriate Uterine Fundus: firm Incision: N/A DVT Evaluation: No evidence of DVT seen on physical exam. Labs: Lab Results  Component Value Date   WBC 10.7 (H) 08/04/2020   HGB 10.1 (L) 08/04/2020   HCT 33.8 (L) 08/04/2020   MCV 75.8 (L) 08/04/2020   PLT 183 08/04/2020   CMP Latest Ref Rng & Units 07/12/2020  Glucose 70 - 99 mg/dL 88  BUN 6 - 20 mg/dL 7  Creatinine 0.44 - 1.00 mg/dL 0.55  Sodium 135 - 145 mmol/L 134(L)  Potassium 3.5 - 5.1 mmol/L 3.8  Chloride 98 - 111 mmol/L 102  CO2 22 - 32 mmol/L 22  Calcium 8.9 - 10.3 mg/dL 8.7(L)  Total Protein 6.5 - 8.1 g/dL 6.1(L)  Total Bilirubin 0.3 - 1.2 mg/dL 0.6  Alkaline Phos 38 - 126 U/L 94  AST 15 - 41 U/L 18  ALT 0 - 44 U/L 13   Edinburgh Score: Edinburgh Postnatal Depression Scale Screening Tool 08/04/2020  I have been able to laugh and see the funny side of things. 0  I have looked forward with enjoyment to things. 0  I have blamed myself unnecessarily when things  went wrong. 2  I have been anxious or worried for no good reason. 2  I have felt scared or panicky for no good reason. 2  Things have been getting on top of me. 1  I have been so unhappy that I have had difficulty sleeping. 0  I have felt sad or miserable. 0  I have been so unhappy that I have been crying. 0  The thought of harming myself has occurred to me. 0  Edinburgh Postnatal Depression Scale Total 7     After visit meds:  Allergies as of 08/05/2020   No Known Allergies     Medication List    TAKE these medications   albuterol 108 (90 Base) MCG/ACT inhaler Commonly known as: VENTOLIN HFA Inhale 2 puffs into the lungs every 4 (four) hours as needed for wheezing or shortness of  breath.   Blood Pressure Kit Devi 1 Device by Does not apply route as needed.   multivitamin-prenatal 27-0.8 MG Tabs tablet Take 1 tablet by mouth daily at 12 noon.        Discharge home in stable condition Infant Feeding: Bottle Infant Disposition:home with mother Discharge instruction: per After Visit Summary and Postpartum booklet. Activity: Advance as tolerated. Pelvic rest for 6 weeks.  Diet: routine diet Future Appointments:No future appointments. Follow up Visit:  [place] for [place] at [facility] Follow up.   Specialty: Obstetrics and Gynecology Why: You will be contacted with a postpartum appointment in 4 weeks Contact information: [address] [place] [address] [phone]             Myrtis Ser, CNM  P [facility] Admin Pool Please schedule this patient for Postpartum visit in: 4 weeks with the following provider: Any provider  In-Person  For C/S patients schedule nurse incision check in weeks 2 weeks: no  High risk pregnancy complicated by: IUGR, increased dopplers  Delivery mode: SVD  Anticipated Birth Control: PP IUD Placed  PP Procedures needed: none  Schedule Integrated BH visit: no    08/05/2020 Wilber Oliphant, MD

## 2020-08-03 NOTE — Progress Notes (Signed)
Patient ID: Brianna Dawson, female   DOB: 01/08/1996, 24 y.o.   MRN: [id number]  Peeked in pt room; she appeared to be resting; has had cytotec x 2 doses and cervical foley is still in place  BP 124/71, P 74 FHR 130-140, +accels, no decels Ctx irreg, mild Cx deferred  IUP@37 .0wks FGR/inc dopplers Cx unfavorable  Will wait until foley comes out, the plan to start Pit when it has been 4hrs since last cytotec   ADDENDUM: informed by RN that foley bulb came out at 1415; will wait until 1720 to start Pit, which is 4hrs past 2nd cytotec dose  [name] CNM 08/03/2020

## 2020-08-03 NOTE — Procedures (Signed)
POST-PLACENTAL IUD INSERTION PROCEDURE NOTE Patient name: Brianna Dawson MRN [id number]  Date of birth: Apr 29, 1996  The risks and benefits of the method and placement have been thouroughly reviewed with the patient and all questions were answered.  Specifically the patient is aware of failure rate of 11/998, expulsion of the IUD and of possible perforation.  The patient is aware of irregular bleeding due to the method and understands the incidence of irregular bleeding diminishes with time.   Signed copy of informed consent in chart.   Vaginal, labial and perineal areas thoroughly inspected for lacerations. Lacerations: n/a laceration identified and if needed was repaired prior to IUD insertion  Pt's IUD choice: Liletta  Time out was performed.    IUD removed from insertion device and grasped between sterile gloved fingers. Fundus identified through abdominal wall using non-insertion hand. IUD inserted to fundus with bimanual technique. IUD carefully released at the fundus and insertion hand gently removed from vagina.    Strings trimmed to the level of the introitus. Patient tolerated procedure well.  Patient given post procedure instructions and IUD care card with expiration date.  Patient is asked to keep IUD strings tucked in her vagina until her postpartum follow up visit in 4-6 weeks. Patient advised to abstain from sexual intercourse and pulling on strings before her follow-up visit. Patient verbalized an understanding of the plan of care and agrees.   Pt added to the post-placental IUD insertion list and charges entered.  LOT: [id number] EXP: 12/2023  [name] CNM 08/03/2020 10:53 PM

## 2020-08-03 NOTE — Anesthesia Procedure Notes (Signed)
Epidural Patient location during procedure: OB Start time: 08/03/2020 5:48 PM End time: 08/03/2020 6:00 PM  Staffing Anesthesiologist: Lowella Curb, MD Performed: anesthesiologist   Preanesthetic Checklist Completed: patient identified, IV checked, site marked, risks and benefits discussed, surgical consent, monitors and equipment checked, pre-op evaluation and timeout performed  Epidural Patient position: sitting Prep: ChloraPrep Patient monitoring: heart rate, cardiac monitor, continuous pulse ox and blood pressure Approach: midline Location: L2-L3 Injection technique: LOR saline  Needle:  Needle type: Tuohy  Needle gauge: 17 G Needle length: 9 cm Needle insertion depth: 6 cm Catheter type: closed end flexible Catheter size: 20 Guage Catheter at skin depth: 10 cm Test dose: negative  Assessment Events: blood not aspirated, injection not painful, no injection resistance, no paresthesia and negative IV test  Additional Notes Reason for block:procedure for pain

## 2020-08-03 NOTE — Anesthesia Preprocedure Evaluation (Signed)
Anesthesia Evaluation  Patient identified by MRN, date of birth, ID band Patient awake    Reviewed: Allergy & Precautions, H&P , NPO status , Patient's Chart, lab work & pertinent test results, reviewed documented beta blocker date and time   History of Anesthesia Complications Negative for: history of anesthetic complications  Airway Mallampati: III  TM Distance: >3 FB Neck ROM: full    Dental  (+) Teeth Intact   Pulmonary neg pulmonary ROS, former smoker,    breath sounds clear to auscultation       Cardiovascular negative cardio ROS   Rhythm:regular Rate:Normal     Neuro/Psych negative neurological ROS  negative psych ROS   GI/Hepatic negative GI ROS, Neg liver ROS,   Endo/Other  negative endocrine ROS  Renal/GU negative Renal ROS     Musculoskeletal   Abdominal   Peds  Hematology negative hematology ROS (+)   Anesthesia Other Findings   Reproductive/Obstetrics (+) Pregnancy                             Anesthesia Physical  Anesthesia Plan  ASA: II  Anesthesia Plan: Epidural   Post-op Pain Management:    Induction:   PONV Risk Score and Plan:   Airway Management Planned:   Additional Equipment:   Intra-op Plan:   Post-operative Plan:   Informed Consent: I have reviewed the patients History and Physical, chart, labs and discussed the procedure including the risks, benefits and alternatives for the proposed anesthesia with the patient or authorized representative who has indicated his/her understanding and acceptance.       Plan Discussed with:   Anesthesia Plan Comments:         Anesthesia Quick Evaluation

## 2020-08-03 NOTE — Progress Notes (Signed)
Patient ID: Brianna Dawson, female   DOB: 19-May-1996, 24 y.o.   MRN: [id number]  Epidural recently placed; feels much better  BP 105/76, P 103 FHR 125, +accels, no decels, occ variables Ctx q 2 mins, spont Cx was 5/70/vtx -2 per RN at 1730  IUP@37 .0wks FGR Early active labor  Plan to check cx in 2 hrs or sooner w urge to push Anticipate vag del  [name] CNM 08/03/2020

## 2020-08-03 NOTE — Discharge Instructions (Signed)

## 2020-08-04 LAB — CBC
HCT: 33.8 % — ABNORMAL LOW (ref 36.0–46.0)
Hemoglobin: 10.1 g/dL — ABNORMAL LOW (ref 12.0–15.0)
MCH: 22.6 pg — ABNORMAL LOW (ref 26.0–34.0)
MCHC: 29.9 g/dL — ABNORMAL LOW (ref 30.0–36.0)
MCV: 75.8 fL — ABNORMAL LOW (ref 80.0–100.0)
Platelets: 183 10*3/uL (ref 150–400)
RBC: 4.46 MIL/uL (ref 3.87–5.11)
RDW: 14.7 % (ref 11.5–15.5)
WBC: 10.7 10*3/uL — ABNORMAL HIGH (ref 4.0–10.5)
nRBC: 0 % (ref 0.0–0.2)

## 2020-08-04 MED ORDER — ONDANSETRON HCL 4 MG PO TABS: 4.0000 mg | ORAL_TABLET | ORAL | Status: DC | PRN

## 2020-08-04 MED ORDER — DIBUCAINE (PERIANAL) 1 % EX OINT: 1.0000 "application " | TOPICAL_OINTMENT | CUTANEOUS | Status: DC | PRN

## 2020-08-04 MED ORDER — SODIUM CHLORIDE 0.9% FLUSH: 3.0000 mL | INTRAVENOUS | Status: DC | PRN

## 2020-08-04 MED ORDER — PRENATAL MULTIVITAMIN CH
1.0000 | ORAL_TABLET | Freq: Every day | ORAL | Status: DC
  Administered 2020-08-04 – 2020-08-05 (×2): 1 via ORAL
  Filled 2020-08-04 (×2): qty 1

## 2020-08-04 MED ORDER — BENZOCAINE-MENTHOL 20-0.5 % EX AERO: 1.0000 "application " | INHALATION_SPRAY | CUTANEOUS | Status: DC | PRN

## 2020-08-04 MED ORDER — ACETAMINOPHEN 325 MG PO TABS
650.0000 mg | ORAL_TABLET | ORAL | Status: DC | PRN
  Administered 2020-08-04: 650 mg via ORAL
  Filled 2020-08-04: qty 2

## 2020-08-04 MED ORDER — SODIUM CHLORIDE 0.9 % IV SOLN: 250.0000 mL | INTRAVENOUS | Status: DC | PRN

## 2020-08-04 MED ORDER — WITCH HAZEL-GLYCERIN EX PADS: 1.0000 "application " | MEDICATED_PAD | CUTANEOUS | Status: DC | PRN

## 2020-08-04 MED ORDER — MEASLES, MUMPS & RUBELLA VAC IJ SOLR: 0.5000 mL | Freq: Once | INTRAMUSCULAR | Status: DC

## 2020-08-04 MED ORDER — IBUPROFEN 600 MG PO TABS
600.0000 mg | ORAL_TABLET | Freq: Four times a day (QID) | ORAL | Status: DC
  Administered 2020-08-04 – 2020-08-05 (×7): 600 mg via ORAL
  Filled 2020-08-04 (×7): qty 1

## 2020-08-04 MED ORDER — SENNOSIDES-DOCUSATE SODIUM 8.6-50 MG PO TABS
1.0000 | ORAL_TABLET | Freq: Two times a day (BID) | ORAL | Status: DC
  Administered 2020-08-04 – 2020-08-05 (×3): 1 via ORAL
  Filled 2020-08-04 (×3): qty 1

## 2020-08-04 MED ORDER — SODIUM CHLORIDE 0.9% FLUSH: 3.0000 mL | Freq: Two times a day (BID) | INTRAVENOUS | Status: DC

## 2020-08-04 MED ORDER — SIMETHICONE 80 MG PO CHEW: 80.0000 mg | CHEWABLE_TABLET | ORAL | Status: DC | PRN

## 2020-08-04 MED ORDER — TETANUS-DIPHTH-ACELL PERTUSSIS 5-2.5-18.5 LF-MCG/0.5 IM SUSP: 0.5000 mL | Freq: Once | INTRAMUSCULAR | Status: DC

## 2020-08-04 MED ORDER — COCONUT OIL OIL: 1.0000 "application " | TOPICAL_OIL | Status: DC | PRN

## 2020-08-04 MED ORDER — ONDANSETRON HCL 4 MG/2ML IJ SOLN: 4.0000 mg | INTRAMUSCULAR | Status: DC | PRN

## 2020-08-04 MED ORDER — DIPHENHYDRAMINE HCL 25 MG PO CAPS: 25.0000 mg | ORAL_CAPSULE | Freq: Four times a day (QID) | ORAL | Status: DC | PRN

## 2020-08-04 NOTE — Progress Notes (Signed)
Postpartum Day 1  Subjective Up ad lib, voiding, tolerating PO, and + flatus. Denies dizziness, lightheadedness, tachycardia. Appropriate Lochia. Pain well controlled.  Objective BP 117/81 (BP Location: Right Arm)    Pulse 92    Temp 97.7 F (36.5 C) (Oral)    Resp 18    Ht 5\' 7"  (1.702 m)    Wt 98.4 kg    LMP 11/18/2019    SpO2 100%    Breastfeeding Unknown    BMI 33.97 kg/m   Intake/Output      10/01 0701 - 10/02 0700   I.V. (mL/kg) 2031.3 (20.6)   Other 0   Total Intake(mL/kg) 2031.3 (20.6)   Urine (mL/kg/hr) 500   Blood 200   Total Output 700   Net +1331.3         Physical Exam:  General: alert, cooperative, NAD Uterine Fundus: firm DVT Evaluation: No evidence of DVT seen on physical exam, extremities warm and well perfused.  MSK: moving extremities spontaneously. Trace BLEE   Recent Labs    08/03/20 0900 08/04/20 0234  HGB 10.7* 10.1*  HCT 35.6* 33.8*    Assessment & Plan Postpartum Day # 1  Plan for d/c tomorrow   Pain: schedule ibuprofen, PRN tylenol  Feed: formula   Contraception: ppIUD placed       LOS: 1 day   10/04/20 08/04/2020, 6:09 AM

## 2020-08-04 NOTE — Anesthesia Postprocedure Evaluation (Signed)
Anesthesia Post Note  Patient: Brianna Dawson  Procedure(s) Performed: AN AD HOC LABOR EPIDURAL     Patient location during evaluation: Mother Baby Anesthesia Type: Epidural Level of consciousness: awake and alert, oriented and patient cooperative Pain management: pain level controlled Vital Signs Assessment: post-procedure vital signs reviewed and stable Respiratory status: spontaneous breathing Cardiovascular status: stable Postop Assessment: no headache, epidural receding, patient able to bend at knees and no signs of nausea or vomiting Anesthetic complications: no Comments: Pt. States she is walking.  Pain score 0.    No complications documented.  Last Vitals:  Vitals:   08/04/20 0112 08/04/20 0551  BP: 124/73 117/81  Pulse: 60 92  Resp: 18 18  Temp: 37 C 36.5 C  SpO2: 100% 100%    Last Pain:  Vitals:   08/04/20 0710  TempSrc:   PainSc: 0-No pain   Pain Goal:                   [facility]

## 2020-09-10 ENCOUNTER — Ambulatory Visit (INDEPENDENT_AMBULATORY_CARE_PROVIDER_SITE_OTHER): Payer: Medicaid Other | Admitting: Obstetrics & Gynecology

## 2020-09-10 ENCOUNTER — Encounter: Payer: Self-pay | Admitting: Obstetrics & Gynecology

## 2020-09-10 ENCOUNTER — Other Ambulatory Visit: Payer: Self-pay

## 2020-09-10 DIAGNOSIS — Z975 Presence of (intrauterine) contraceptive device: Secondary | ICD-10-CM | POA: Diagnosis not present

## 2020-09-10 NOTE — Patient Instructions (Signed)
Levonorgestrel intrauterine device (IUD) What is this medicine? LEVONORGESTREL IUD (LEE voe nor jes trel) is a contraceptive (birth control) device. The device is placed inside the uterus by a healthcare professional. It is used to prevent pregnancy. This device can also be used to treat heavy bleeding that occurs during your period. This medicine may be used for other purposes; ask your health care provider or pharmacist if you have questions. COMMON BRAND NAME(S): Kyleena, LILETTA, Mirena, Skyla What should I tell my health care provider before I take this medicine? They need to know if you have any of these conditions:  abnormal Pap smear  cancer of the breast, uterus, or cervix  diabetes  endometritis  genital or pelvic infection now or in the past  have more than one sexual partner or your partner has more than one partner  heart disease  history of an ectopic or tubal pregnancy  immune system problems  IUD in place  liver disease or tumor  problems with blood clots or take blood-thinners  seizures  use intravenous drugs  uterus of unusual shape  vaginal bleeding that has not been explained  an unusual or allergic reaction to levonorgestrel, other hormones, silicone, or polyethylene, medicines, foods, dyes, or preservatives  pregnant or trying to get pregnant  breast-feeding How should I use this medicine? This device is placed inside the uterus by a health care professional. Talk to your pediatrician regarding the use of this medicine in children. Special care may be needed. Overdosage: If you think you have taken too much of this medicine contact a poison control center or emergency room at once. NOTE: This medicine is only for you. Do not share this medicine with others. What if I miss a dose? This does not apply. Depending on the brand of device you have inserted, the device will need to be replaced every 3 to 6 years if you wish to continue using this type  of birth control. What may interact with this medicine? Do not take this medicine with any of the following medications:  amprenavir  bosentan  fosamprenavir This medicine may also interact with the following medications:  aprepitant  armodafinil  barbiturate medicines for inducing sleep or treating seizures  bexarotene  boceprevir  griseofulvin  medicines to treat seizures like carbamazepine, ethotoin, felbamate, oxcarbazepine, phenytoin, topiramate  modafinil  pioglitazone  rifabutin  rifampin  rifapentine  some medicines to treat HIV infection like atazanavir, efavirenz, indinavir, lopinavir, nelfinavir, tipranavir, ritonavir  St. John's wort  warfarin This list may not describe all possible interactions. Give your health care provider a list of all the medicines, herbs, non-prescription drugs, or dietary supplements you use. Also tell them if you smoke, drink alcohol, or use illegal drugs. Some items may interact with your medicine. What should I watch for while using this medicine? Visit your doctor or health care professional for regular check ups. See your doctor if you or your partner has sexual contact with others, becomes HIV positive, or gets a sexual transmitted disease. This product does not protect you against HIV infection (AIDS) or other sexually transmitted diseases. You can check the placement of the IUD yourself by reaching up to the top of your vagina with clean fingers to feel the threads. Do not pull on the threads. It is a good habit to check placement after each menstrual period. Call your doctor right away if you feel more of the IUD than just the threads or if you cannot feel the threads at   all. The IUD may come out by itself. You may become pregnant if the device comes out. If you notice that the IUD has come out use a backup birth control method like condoms and call your health care provider. Using tampons will not change the position of the  IUD and are okay to use during your period. This IUD can be safely scanned with magnetic resonance imaging (MRI) only under specific conditions. Before you have an MRI, tell your healthcare provider that you have an IUD in place, and which type of IUD you have in place. What side effects may I notice from receiving this medicine? Side effects that you should report to your doctor or health care professional as soon as possible:  allergic reactions like skin rash, itching or hives, swelling of the face, lips, or tongue  fever, flu-like symptoms  genital sores  high blood pressure  no menstrual period for 6 weeks during use  pain, swelling, warmth in the leg  pelvic pain or tenderness  severe or sudden headache  signs of pregnancy  stomach cramping  sudden shortness of breath  trouble with balance, talking, or walking  unusual vaginal bleeding, discharge  yellowing of the eyes or skin Side effects that usually do not require medical attention (report to your doctor or health care professional if they continue or are bothersome):  acne  breast pain  change in sex drive or performance  changes in weight  cramping, dizziness, or faintness while the device is being inserted  headache  irregular menstrual bleeding within first 3 to 6 months of use  nausea This list may not describe all possible side effects. Call your doctor for medical advice about side effects. You may report side effects to FDA at 1-800-FDA-1088. Where should I keep my medicine? This does not apply. NOTE: This sheet is a summary. It may not cover all possible information. If you have questions about this medicine, talk to your doctor, pharmacist, or health care provider.  2020 Elsevier/Gold Standard (2018-08-31 13:22:01)  

## 2020-09-10 NOTE — Progress Notes (Signed)
    Post Partum Visit Note  Brianna Dawson is a 24 y.o. [id number] female who presents for a postpartum visit. She is 5 weeks 3 days postpartum following a normal spontaneous vaginal delivery.  I have fully reviewed the prenatal and intrapartum course. The delivery was at 37 gestational weeks.  Anesthesia: epidural. Postpartum course has been normal. Baby is doing well. Baby is feeding by bottle - Gerber Gentle Soothe. Bleeding is moderate; changing pad every hour for comfort. Bowel function is normal. Bladder function is normal. Patient is sexually active. Contraception method is [place] IUD. Postpartum depression screening: negative.  IU Dwas placed post placenta The pregnancy intention screening data noted above was reviewed. Potential methods of contraception were discussed. The patient elected to proceed with IUD or IUS.      The following portions of the patient's history were reviewed and updated as appropriate: allergies, current medications, past family history, past medical history, past social history, past surgical history and problem list.  Review of Systems Pertinent items are noted in HPI.    Objective:  unknown if currently breastfeeding.  General:  alert, cooperative and no distress                normal  Vagina: normal vagina  Cervix:  no lesions string at the os was trimmed to 3 cm  Corpus: not examined  Adnexa:  not evaluated  Rectal Exam: Not performed.        Assessment:    normal postpartum exam. Pap smear not done at today's visit.   Plan:   Essential components of care per ACOG recommendations:  1.  Mood and well being: Patient with negative depression screening today. Reviewed local resources for support.  - Patient does use tobacco. If using tobacco we discussed reduction and for recently cessation risk of relapse - hx of drug use? No    2. Infant care and feeding:  -Patient currently breastmilk feeding? No -Social determinants of health (SDOH)  reviewed in EPIC. No concerns  3. Sexuality, contraception and birth spacing - Patient does not want a pregnancy in the next year.  Desired family size is 3 children.  - Reviewed forms of contraception in tiered fashion. Patient desired IUD today.   - Discussed birth spacing of 18 months  4. Sleep and fatigue -Encouraged family/partner/community support of 4 hrs of uninterrupted sleep to help with mood and fatigue  5. Physical Recovery  - Discussed patients delivery - Patient has urinary incontinence? No  - Patient is safe to resume physical and sexual activity  6.  Health Maintenance - Last pap smear done 02/29/20 and was normal with no HPV testing.  Adam Phenix, MD  Center for [facility], [facility]

## 2021-01-31 ENCOUNTER — Encounter: Payer: Self-pay | Admitting: Emergency Medicine

## 2021-01-31 ENCOUNTER — Ambulatory Visit
Admission: EM | Admit: 2021-01-31 | Discharge: 2021-01-31 | Disposition: A | Discharge status: Home / Self Care | Payer: Medicaid Other | Attending: Family Medicine | Admitting: Family Medicine

## 2021-01-31 ENCOUNTER — Other Ambulatory Visit: Payer: Self-pay

## 2021-01-31 DIAGNOSIS — L0231 Cutaneous abscess of buttock: Secondary | ICD-10-CM | POA: Diagnosis not present

## 2021-01-31 MED ORDER — DOXYCYCLINE HYCLATE 100 MG PO CAPS: 100.0000 mg | ORAL_CAPSULE | Freq: Two times a day (BID) | ORAL | 0 refills | Status: DC

## 2021-01-31 MED ORDER — HYDROCODONE-ACETAMINOPHEN 5-325 MG PO TABS: 1.0000 | ORAL_TABLET | Freq: Four times a day (QID) | ORAL | 0 refills | Status: DC | PRN

## 2021-01-31 NOTE — ED Triage Notes (Signed)
Pt presents with abscess in between buttock cheeks xs 3-4 days.

## 2021-01-31 NOTE — Discharge Instructions (Signed)

## 2021-01-31 NOTE — ED Provider Notes (Signed)
[facility]   [id number] 01/31/21 Arrival Time: 1313  ASSESSMENT & PLAN:  1. Abscess of buttock, right     Incision and Drainage Procedure Note  Anesthesia: 1% plain lidocaine  Procedure Details  The procedure, risks and complications have been discussed in detail (including, but not limited to pain and bleeding) with the patient.  The skin induration was prepped and draped in the usual fashion. After adequate local anesthesia, I&D with a #11 blade was performed on the right mid buttock with copious, purulent drainage.  EBL: minimal Drains: none Packing: 1/4 inch Condition: Tolerated procedure well Complications: none.  Meds ordered this encounter  Medications  . doxycycline (VIBRAMYCIN) 100 MG capsule    Sig: Take 1 capsule (100 mg total) by mouth 2 (two) times daily.    Dispense:  14 capsule    Refill:  0  . HYDROcodone-acetaminophen (NORCO/VICODIN) 5-325 MG tablet    Sig: Take 1 tablet by mouth every 6 (six) hours as needed for moderate pain or severe pain.    Dispense:  8 tablet    Refill:  0    Wound care instructions discussed and given in written format. To return in 48 hours for wound check.  Finish all antibiotics. OTC analgesics as needed.  Reviewed expectations re: course of current medical issues. Questions answered. Outlined signs and symptoms indicating need for more acute intervention. Patient verbalized understanding. After Visit Summary given.   SUBJECTIVE:  Brianna Dawson is a 25 y.o. female who presents with a possible infection of her R buttock. Onset gradual, approximately 3 days ago without active drainage and without active bleeding. Symptoms have gradually worsened since beginning. Increased pain. Fever: absent. OTC/home treatment: none.   OBJECTIVE:  Vitals:   01/31/21 1321  BP: 115/80  Pulse: (!) 106  Resp: 17  Temp: 98.5 F (36.9 C)  TempSrc: Oral  SpO2: 98%    Slight tachycardia noted. In pain. General appearance:  alert; no distress R buttock: approx 2x3 cm induration of her R mid buttock; tender to touch; no active drainage or bleeding Psychological: alert and cooperative; normal mood and affect  No Known Allergies  Past Medical History:  Diagnosis Date  . Medical history non-contributory    Social History   Socioeconomic History  . Marital status: Single    Spouse name: Not on file  . Number of children: Not on file  . Years of education: Not on file  . Highest education level: Not on file  Occupational History  . Not on file  Tobacco Use  . Smoking status: Current Some Day Smoker    Types: Cigars    Last attempt to quit: 01/22/2020    Years since quitting: 1.0  . Smokeless tobacco: Never Used  . Tobacco comment: 2 cigars a day  Vaping Use  . Vaping Use: Never used  Substance and Sexual Activity  . Alcohol use: Not Currently    Comment: socially, occasionally  . Drug use: Not Currently    Types: Marijuana    Comment: last time several years ago  . Sexual activity: Yes    Birth control/protection: None  Other Topics Concern  . Not on file  Social History Narrative  . Not on file   Social Determinants of Health   Financial Resource Strain: Low Risk   . Difficulty of Paying Living Expenses: Not hard at all  Food Insecurity: No Food Insecurity  . Worried About Programme researcher, broadcasting/film/video in the Last Year: Never true  .  Ran Out of Food in the Last Year: Never true  Transportation Needs: No Transportation Needs  . Lack of Transportation (Medical): No  . Lack of Transportation (Non-Medical): No  Physical Activity: Not on file  Stress: Not on file  Social Connections: Not on file   Family History  Problem Relation Age of Onset  . Hypertension Maternal Grandfather    Past Surgical History:  Procedure Laterality Date  . NO PAST SURGERIES             Mardella Layman, MD 01/31/21 1348

## 2021-02-18 ENCOUNTER — Other Ambulatory Visit: Payer: Self-pay

## 2021-02-18 ENCOUNTER — Ambulatory Visit
Admission: EM | Admit: 2021-02-18 | Discharge: 2021-02-18 | Disposition: A | Discharge status: Home / Self Care | Payer: Medicaid Other | Attending: Family Medicine | Admitting: Family Medicine

## 2021-02-18 DIAGNOSIS — L739 Follicular disorder, unspecified: Secondary | ICD-10-CM | POA: Insufficient documentation

## 2021-02-18 DIAGNOSIS — N9089 Other specified noninflammatory disorders of vulva and perineum: Secondary | ICD-10-CM | POA: Insufficient documentation

## 2021-02-18 LAB — POCT URINALYSIS DIP (MANUAL ENTRY)
Bilirubin, UA: NEGATIVE
Blood, UA: NEGATIVE
Glucose, UA: NEGATIVE mg/dL
Ketones, POC UA: NEGATIVE mg/dL
Leukocytes, UA: NEGATIVE
Nitrite, UA: NEGATIVE
Protein Ur, POC: NEGATIVE mg/dL
Spec Grav, UA: 1.025 (ref 1.010–1.025)
Urobilinogen, UA: 0.2 E.U./dL
pH, UA: 6.5 (ref 5.0–8.0)

## 2021-02-18 LAB — POCT URINE PREGNANCY: Preg Test, Ur: NEGATIVE

## 2021-02-18 MED ORDER — DOXYCYCLINE HYCLATE 100 MG PO CAPS: 100.0000 mg | ORAL_CAPSULE | Freq: Two times a day (BID) | ORAL | 0 refills | Status: AC

## 2021-02-18 MED ORDER — CLOTRIMAZOLE-BETAMETHASONE 1-0.05 % EX CREA: TOPICAL_CREAM | CUTANEOUS | 0 refills | Status: DC

## 2021-02-18 MED ORDER — DOXYCYCLINE HYCLATE 100 MG PO CAPS: 100.0000 mg | ORAL_CAPSULE | Freq: Two times a day (BID) | ORAL | 0 refills | Status: DC

## 2021-02-18 NOTE — ED Provider Notes (Signed)
[name]    CSN: [id number] Arrival date & time: 02/18/21  0830      History   Chief Complaint Chief Complaint  Patient presents with  . Vaginal Itching    HPI Brianna Dawson is a 25 y.o. female.   HPI  Patient here for evaluation of right labial irritation x a few days after shaving. She reports the area itches and becomes painful after scratching. No known exposure to STD. Symptoms started immediately following shaving. She also has a pus filled lesion on the pubis region which is tender to touch. This also erupted following shaving.  Past Medical History:  Diagnosis Date  . Medical history non-contributory     Patient Active Problem List   Diagnosis Date Noted  . Presence of 52 mg levonorgestrel-releasing intrauterine device (IUD) 09/10/2020    Past Surgical History:  Procedure Laterality Date  . NO PAST SURGERIES      OB History    Gravida  3   Para  3   Term  3   Preterm  0   AB  0   Living  3     SAB  0   IAB  0   Ectopic  0   Multiple  0   Live Births  3            Home Medications    Prior to Admission medications   Medication Sig Start Date End Date Taking? Authorizing Provider  clotrimazole-betamethasone (LOTRISONE) cream Apply to affected area 2 times daily x 7 days 02/18/21  Yes Bing Neighbors, FNP  doxycycline (VIBRAMYCIN) 100 MG capsule Take 1 capsule (100 mg total) by mouth 2 (two) times daily for 7 days. 02/18/21 02/25/21  Bing Neighbors, FNP  HYDROcodone-acetaminophen (NORCO/VICODIN) 5-325 MG tablet Take 1 tablet by mouth every 6 (six) hours as needed for moderate pain or severe pain. 01/31/21   Mardella Layman, MD  albuterol (VENTOLIN HFA) 108 (90 Base) MCG/ACT inhaler Inhale 2 puffs into the lungs every 4 (four) hours as needed for wheezing or shortness of breath. Patient not taking: Reported on 09/10/2020 12/18/19 01/31/21  Hall-Potvin, [place], PA-C    Family History Family History  Problem Relation  Age of Onset  . Hypertension Maternal Grandfather     Social History Social History   Tobacco Use  . Smoking status: Current Some Day Smoker    Types: Cigars    Last attempt to quit: 01/22/2020    Years since quitting: 1.0  . Smokeless tobacco: Never Used  . Tobacco comment: 2 cigars a day  Vaping Use  . Vaping Use: Never used  Substance Use Topics  . Alcohol use: Not Currently    Comment: socially, occasionally  . Drug use: Not Currently    Types: Marijuana    Comment: last time several years ago     Allergies   Patient has no known allergies.   Review of Systems Review of Systems Pertinent negatives listed in HPI   Physical Exam Triage Vital Signs ED Triage Vitals  Enc Vitals Group     BP 02/18/21 0843 114/74     Pulse Rate 02/18/21 0843 93     Resp 02/18/21 0843 18     Temp 02/18/21 0843 98.1 F (36.7 C)     Temp src --      SpO2 02/18/21 0843 95 %     Weight --      Height --      Head Circumference --  Peak Flow --      Pain Score 02/18/21 0841 0     Pain Loc --      Pain Edu? --      Excl. in GC? --    No data found.  Updated Vital Signs BP 114/74   Pulse 93   Temp 98.1 F (36.7 C)   Resp 18   LMP 01/24/2021   SpO2 95%   Visual Acuity Right Eye Distance:   Left Eye Distance:   Bilateral Distance:    Right Eye Near:   Left Eye Near:    Bilateral Near:     Physical Exam Exam conducted with a chaperone present.  Constitutional:      Appearance: Normal appearance.  Cardiovascular:     Rate and Rhythm: Normal rate and regular rhythm.  Pulmonary:     Effort: Pulmonary effort is normal.     Breath sounds: Normal breath sounds.  Genitourinary:    Labia:        Right: Tenderness present.     Skin:    Capillary Refill: Capillary refill takes less than 2 seconds.  Neurological:     General: No focal deficit present.     Mental Status: She is alert and oriented to person, place, and time.  Psychiatric:        Mood and Affect:  Mood normal.        Behavior: Behavior normal.        Thought Content: Thought content normal.        Judgment: Judgment normal.      UC Treatments / Results  Labs (all labs ordered are listed, but only abnormal results are displayed) Labs Reviewed  POCT URINALYSIS DIP (MANUAL ENTRY) - Abnormal; Notable for the following components:      Result Value   Color, UA yellow (*)    All other components within normal limits  POCT URINE PREGNANCY  CERVICOVAGINAL ANCILLARY ONLY    EKG   Radiology No results found.  Procedures Procedures (including critical care time)  Medications Ordered in UC Medications - No data to display  Initial Impression / Assessment and Plan / UC Course  I have reviewed the triage vital signs and the nursing notes.  Pertinent labs & imaging results that were available during my care of the patient were reviewed by me and considered in my medical decision making (see chart for details).    Vaginal cytology pending.  UA unremarkable and urine pregnancy negative. Exam of vaginal labia right revealed the presence of severe excoriation and in the pubis region lesion consistent with that of folliculitis.  Treating labial irritation with Lotrisone twice daily for 7 days.  For folliculitis doxycycline twice daily x7 days.  Patient advised that if any additional treatment is warranted following the results of cytology we will notify her via phone. Final Clinical Impressions(s) / UC Diagnoses   Final diagnoses:  Labia irritation  Folliculitis     Discharge Instructions     Your urine results are normal.  Your vaginal cytology will result within the next 24 no more than 48 hours directly to your MyChart.  If any treatment is warranted we will contact you via phone. Complete all medication as prescribed.  If any of your symptoms worsen follow-up with primary care provider return for evaluation.    ED Prescriptions    Medication Sig Dispense Auth. Provider    doxycycline (VIBRAMYCIN) 100 MG capsule Take 1 capsule (100 mg total) by mouth 2 (  two) times daily for 7 days. 14 capsule Bing Neighbors, FNP   clotrimazole-betamethasone (LOTRISONE) cream Apply to affected area 2 times daily x 7 days 15 g Bing Neighbors, FNP     PDMP not reviewed this encounter.   [place], [place], [place] 02/18/21 [phone]

## 2021-02-18 NOTE — ED Triage Notes (Signed)
Pt presents with vaginal irritation that began a few days ago, pt not concerned for std but does want testing

## 2021-02-18 NOTE — Discharge Instructions (Addendum)
Your urine results are normal.  Your vaginal cytology will result within the next 24 no more than 48 hours directly to your MyChart.  If any treatment is warranted we will contact you via phone. Complete all medication as prescribed.  If any of your symptoms worsen follow-up with primary care provider return for evaluation.

## 2021-02-18 NOTE — ED Triage Notes (Signed)
Pt states irritation is on labia and began after shaving

## 2021-02-19 LAB — CERVICOVAGINAL ANCILLARY ONLY
Bacterial Vaginitis (gardnerella): NEGATIVE
Candida Glabrata: NEGATIVE
Candida Vaginitis: POSITIVE — AB
Chlamydia: NEGATIVE
Comment: NEGATIVE
Comment: NEGATIVE
Comment: NEGATIVE
Comment: NEGATIVE
Comment: NEGATIVE
Comment: NORMAL
Neisseria Gonorrhea: NEGATIVE
Trichomonas: NEGATIVE

## 2021-02-20 ENCOUNTER — Telehealth (HOSPITAL_COMMUNITY): Payer: Self-pay | Admitting: Emergency Medicine

## 2021-02-20 MED ORDER — FLUCONAZOLE 150 MG PO TABS: 150.0000 mg | ORAL_TABLET | Freq: Once | ORAL | 0 refills | Status: AC

## 2021-03-16 IMAGING — US US MFM OB FOLLOW-UP
1 series · 13 of 28 positions shown · non-contrast
Comparison: none

[Series 1: us mfm ob follow-up · 73 acquisitions, 13 frames shown]
[im 3/73]
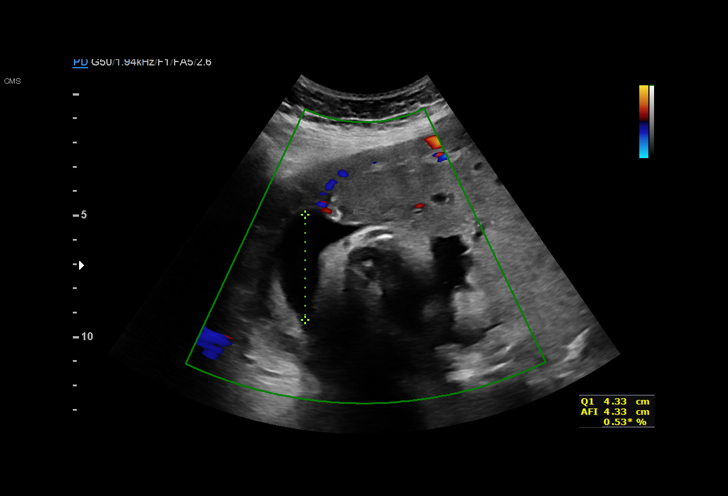
[im 9/73]
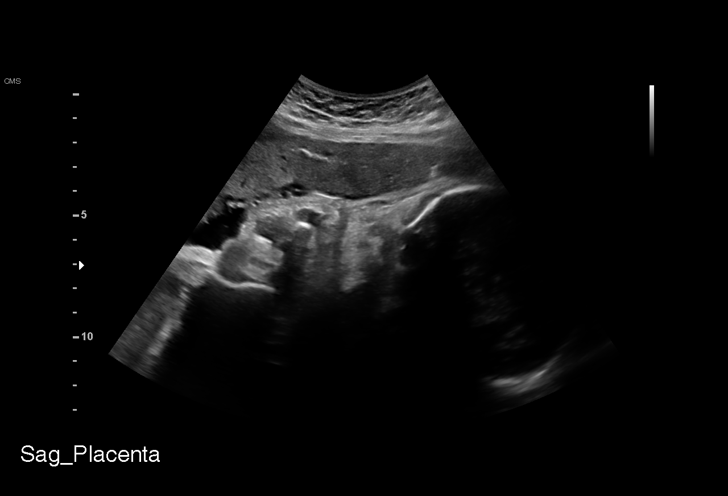
[im 14/73]
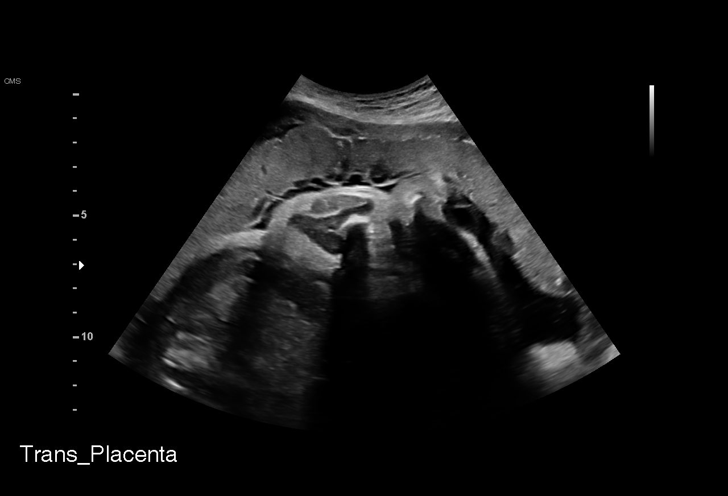
[im 19/73]
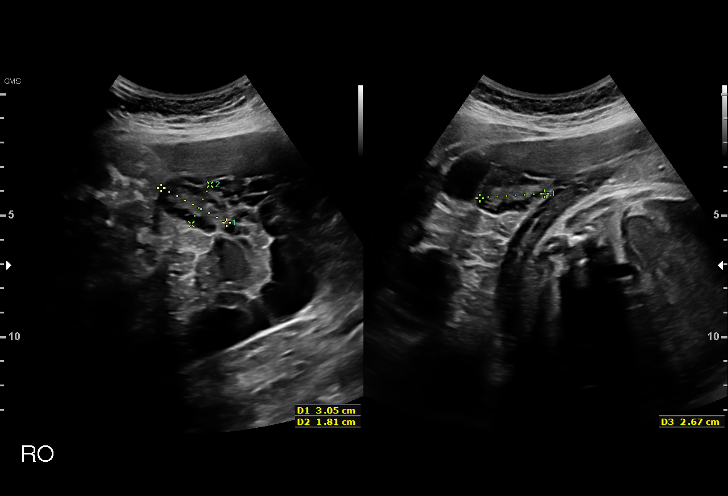
[im 25/73]
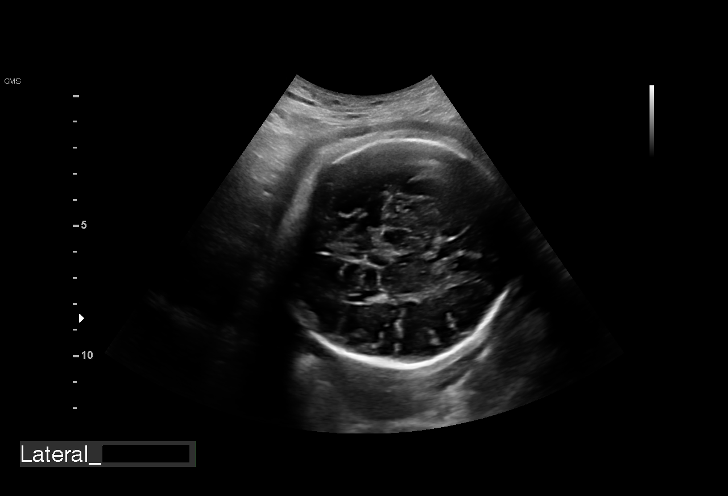
[im 30/73]
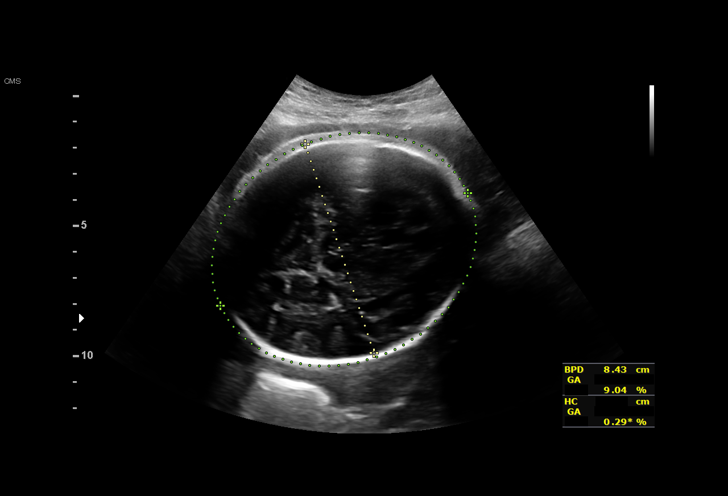
[im 38/73]
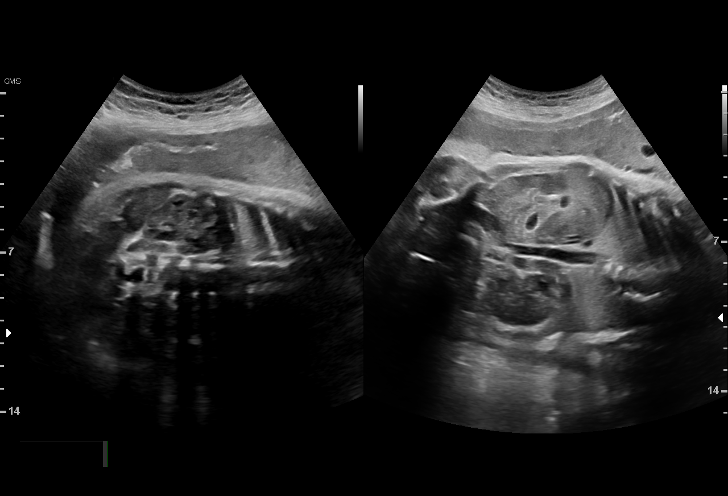
[im 43/73]
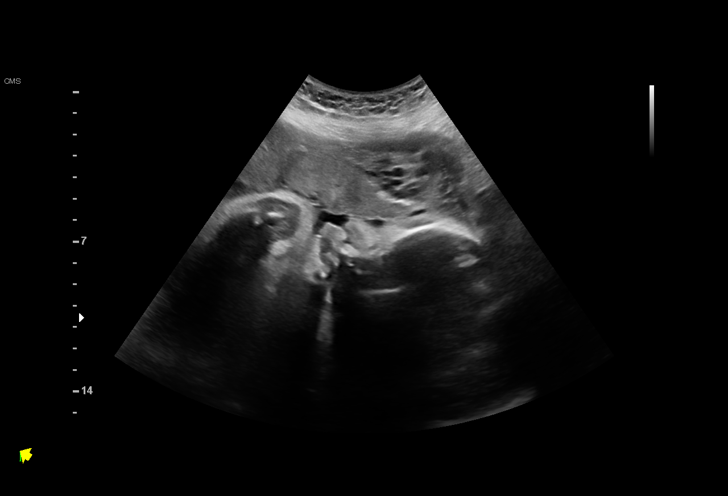
[im 49/73]
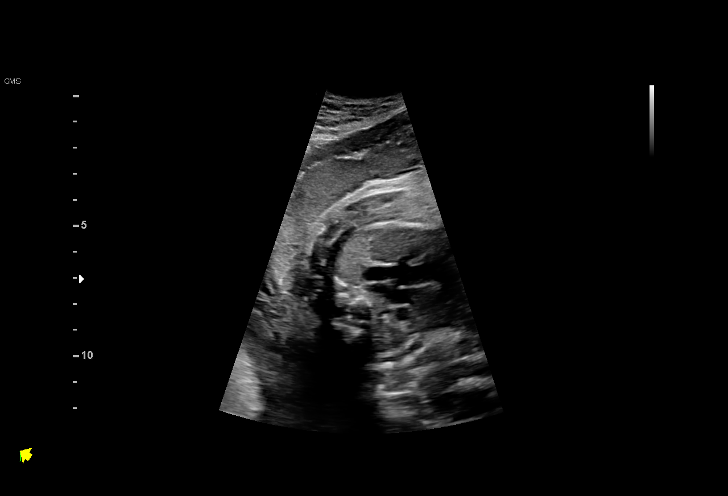
[im 54/73]
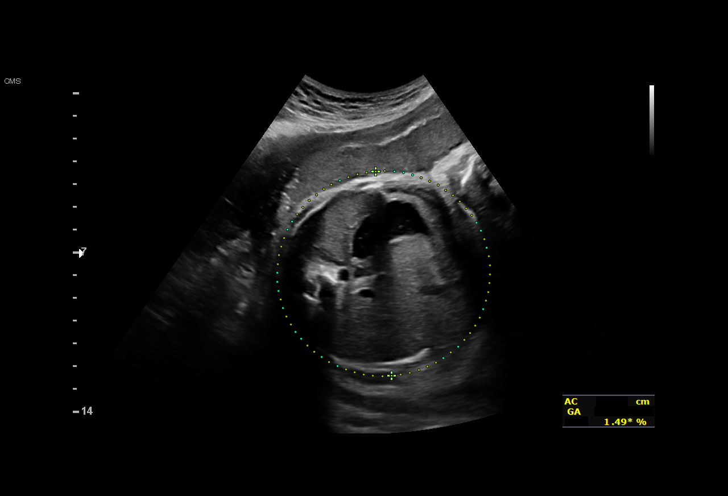
[im 59/73]
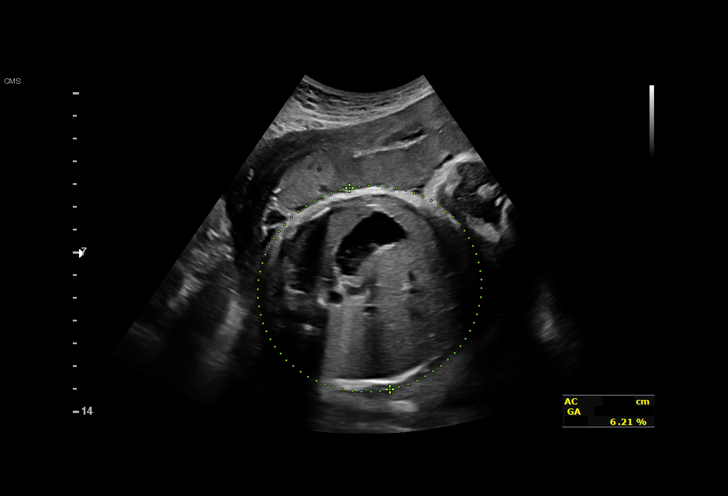
[im 65/73]
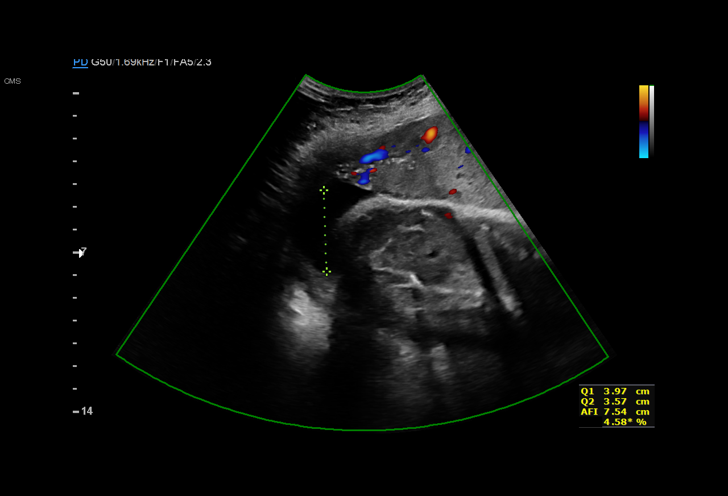
[im 70/73]
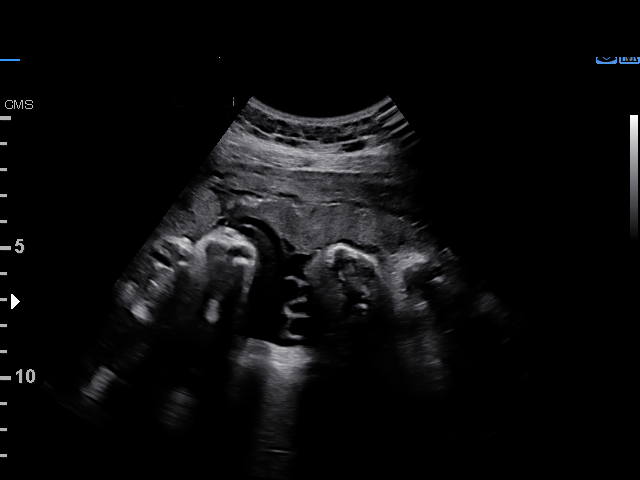

[13 of 28 positions shown; findings below may reference images not displayed]

W/NONSTRESS

Indications

 Abnormal fetal ultrasound (bradycardia
 earlier in preg)
 Obesity complicating pregnancy, third
 trimester (pregravid BMI 33)
 Genetic carrier (Dyamond Ugalde)
 Low risk NIPS, 6.0 FF
 36 weeks gestation of pregnancy
 Maternal care for known or suspected poor
 fetal growth, third trimester, not applicable or
 unspecified IUGR
Fetal Evaluation

 Num Of Fetuses:         1
 Fetal Heart Rate(bpm):  140
 Cardiac Activity:       Observed
 Presentation:           Cephalic
 Placenta:               Anterior
 P. Cord Insertion:      Visualized

 Amniotic Fluid
 AFI FV:      Within normal limits

 AFI Sum(cm)     %Tile       Largest Pocket(cm)
 11.6            34
 RUQ(cm)       RLQ(cm)       LUQ(cm)        LLQ(cm)

Biophysical Evaluation

 Amniotic F.V:   Within normal limits       F. Tone:        Observed
 F. Movement:    Observed                   N.S.T:          Reactive
 F. Breathing:   Observed                   Score:          [DATE]
Biometry

 BPD:      84.5  mm     G. Age:  34w 0d         10  %    CI:        78.16   %    70 - 86
                                                         FL/HC:      21.9   %    20.1 -
 HC:      302.4  mm     G. Age:  33w 4d        < 1  %    HC/AC:      1.03        0.93 -
 AC:      293.4  mm     G. Age:  33w 2d        3.7  %    FL/BPD:     78.3   %    71 - 87
 FL:       66.2  mm     G. Age:  34w 1d          7  %    FL/AC:      22.6   %    20 - 24
 HUM:      56.8  mm     G. Age:  33w 0d         12  %

 Est. FW:    6654  gm    4 lb 15 oz       5  %
OB History

 Gravidity:    3         Term:   2        Prem:   0        SAB:   0
 TOP:          0       Ectopic:  0        Living: 2
Gestational Age

 LMP:           36w 0d        Date:  11/18/19                 EDD:   08/24/20
 U/S Today:     33w 5d                                        EDD:   09/09/20
 Best:          36w 0d     Det. By:  LMP  (11/18/19)          EDD:   08/24/20
Anatomy

 Cranium:               Appears normal         LVOT:                   Previously seen
 Cavum:                 Appears normal         Aortic Arch:            Previously seen
 Ventricles:            Appears normal         Ductal Arch:            Previously seen
 Choroid Plexus:        Previously seen        Diaphragm:              Appears normal
 Cerebellum:            Previously seen        Stomach:                Appears normal, left
                                                                       sided
 Posterior Fossa:       Previously seen        Abdomen:                Appears normal
 Nuchal Fold:           Previously seen        Abdominal Wall:         Previously seen
 Face:                  Appears normal         Cord Vessels:           Previously seen
                        (orbits and profile)
 Lips:                  Appears normal         Kidneys:                Appear normal
 Palate:                Not well visualized    Bladder:                Appears normal
 Thoracic:              Appears normal         Spine:                  Previously seen
 Heart:                 Appears normal         Upper Extremities:      Previously seen
                        (4CH, axis, and
                        situs)
 RVOT:                  Previously seen        Lower Extremities:      Previously seen

 Other:  Heels and nasal bone previously visualized.
Doppler - Fetal Vessels

 Umbilical Artery
  S/D     %tile      RI    %tile      PI    %tile            ADFV    RDFV
  3.92   > 97.5    0.74       98    1.37   > 97.5               No      No
Cervix Uterus Adnexa

 Cervix
 Not visualized (advanced GA >54wks)

 Uterus
 No abnormality visualized.

 Right Ovary
 Within normal limits.

 Left Ovary
 Within normal limits.

 Cul De Sac
 No free fluid seen.

 Adnexa
 No abnormality visualized.
Impression

 Maternal obesity.  Patient returned for fetal growth
 assessment.
 On ultrasound, the estimated fetal weight is the 5th
 percentile.  Abdominal circumference measurement is at the
 4th percentile and head circumference measurement is
 between -2 and -1 SD.  Amniotic fluid is normal and good
 fetal activity seen.  Umbilical artery Doppler showed
 increased S/D ratio.  NST is reactive.  BPP [DATE].

 I explained the findings.  Fetal growth restriction with
 abnormal umbilical artery Doppler study or associated with
 increased risk of perinatal mortality.  I recommend delivery at
 37 weeks gestation.

 Discussed with Dr. Tiger, Ob Attending to schedule delivery.
Recommendations

 Recommend delivery at 37 weeks' gestation.
                 Liem, Gavy

## 2021-03-29 ENCOUNTER — Other Ambulatory Visit: Payer: Self-pay

## 2021-03-29 ENCOUNTER — Ambulatory Visit
Admission: RE | Admit: 2021-03-29 | Discharge: 2021-03-29 | Disposition: A | Discharge status: Home / Self Care | Payer: Medicaid Other | Source: Ambulatory Visit | Attending: Emergency Medicine | Admitting: Emergency Medicine

## 2021-03-29 VITALS — BP 111/76 | HR 100 | Temp 99.1°F | Resp 19

## 2021-03-29 DIAGNOSIS — K611 Rectal abscess: Secondary | ICD-10-CM

## 2021-03-29 MED ORDER — DOXYCYCLINE HYCLATE 100 MG PO CAPS: 100.0000 mg | ORAL_CAPSULE | Freq: Two times a day (BID) | ORAL | 0 refills | Status: AC

## 2021-03-29 MED ORDER — LIDOCAINE HCL 3 % EX CREA: 1.0000 "application " | TOPICAL_CREAM | CUTANEOUS | 0 refills | Status: DC | PRN

## 2021-03-29 MED ORDER — IBUPROFEN 800 MG PO TABS: 800.0000 mg | ORAL_TABLET | Freq: Three times a day (TID) | ORAL | 0 refills | Status: DC

## 2021-03-29 NOTE — Discharge Instructions (Signed)
Begin doxycycline x10 days Tylenol and ibuprofen for pain May apply lidocaine cream to area Warm soaks in bathtub or warm compresses If not seeing any improvement over the next 3 to 4 days please follow-up

## 2021-03-29 NOTE — ED Provider Notes (Signed)
[name]    CSN: [id number] Arrival date & time: 03/29/21  1256      History   Chief Complaint Chief Complaint  Patient presents with  . Appointment  . Abscess    HPI Brianna Dawson is a 25 y.o. female presenting today for evaluation of an abscess.  Reports abscess to left buttock.  Reports history of similar requiring I&D.  Reports overall symptoms did improve, but reports over the past few weeks she has had worsening symptoms again.  Pain and swelling over the the past few days.  Denies fevers.  Denies rectal pain or changes with bowel movements.  HPI  Past Medical History:  Diagnosis Date  . Medical history non-contributory     Patient Active Problem List   Diagnosis Date Noted  . Presence of 52 mg levonorgestrel-releasing intrauterine device (IUD) 09/10/2020    Past Surgical History:  Procedure Laterality Date  . NO PAST SURGERIES      OB History    Gravida  3   Para  3   Term  3   Preterm  0   AB  0   Living  3     SAB  0   IAB  0   Ectopic  0   Multiple  0   Live Births  3            Home Medications    Prior to Admission medications   Medication Sig Start Date End Date Taking? Authorizing Provider  doxycycline (VIBRAMYCIN) 100 MG capsule Take 1 capsule (100 mg total) by mouth 2 (two) times daily for 10 days. 03/29/21 04/08/21 Yes Lynsay Fesperman C, PA-C  ibuprofen (ADVIL) 800 MG tablet Take 1 tablet (800 mg total) by mouth 3 (three) times daily. 03/29/21  Yes Bettylou Frew C, PA-C  lidocaine (LINDAMANTLE) 3 % CREA cream Apply 1 application topically as needed. 03/29/21  Yes Cleven Jansma C, PA-C  albuterol (VENTOLIN HFA) 108 (90 Base) MCG/ACT inhaler Inhale 2 puffs into the lungs every 4 (four) hours as needed for wheezing or shortness of breath. Patient not taking: Reported on 09/10/2020 12/18/19 01/31/21  Hall-Potvin, [place], PA-C    Family History Family History  Problem Relation Age of Onset  . Hypertension  Maternal Grandfather     Social History Social History   Tobacco Use  . Smoking status: Current Some Day Smoker    Types: Cigars    Last attempt to quit: 01/22/2020    Years since quitting: 1.1  . Smokeless tobacco: Never Used  . Tobacco comment: 2 cigars a day  Vaping Use  . Vaping Use: Never used  Substance Use Topics  . Alcohol use: Not Currently    Comment: socially, occasionally  . Drug use: Not Currently    Types: Marijuana    Comment: last time several years ago     Allergies   Patient has no known allergies.   Review of Systems Review of Systems  Constitutional: Negative for fever.  Respiratory: Negative for shortness of breath.   Cardiovascular: Negative for chest pain.  Gastrointestinal: Negative for abdominal pain, diarrhea, nausea and vomiting.  Genitourinary: Negative for dysuria, flank pain, genital sores, hematuria, menstrual problem, vaginal bleeding, vaginal discharge and vaginal pain.  Musculoskeletal: Negative for back pain.  Skin: Negative for rash.  Neurological: Negative for dizziness, light-headedness and headaches.     Physical Exam Triage Vital Signs ED Triage Vitals  Enc Vitals Group     BP  Pulse      Resp      Temp      Temp src      SpO2      Weight      Height      Head Circumference      Peak Flow      Pain Score      Pain Loc      Pain Edu?      Excl. in GC?    No data found.  Updated Vital Signs BP 111/76   Pulse 100   Temp 99.1 F (37.3 C)   Resp 19   LMP 03/15/2021   SpO2 95%   Visual Acuity Right Eye Distance:   Left Eye Distance:   Bilateral Distance:    Right Eye Near:   Left Eye Near:    Bilateral Near:     Physical Exam Vitals and nursing note reviewed.  Constitutional:      Appearance: She is well-developed.     Comments: No acute distress  HENT:     Head: Normocephalic and atraumatic.     Nose: Nose normal.  Eyes:     Conjunctiva/sclera: Conjunctivae normal.  Cardiovascular:      Rate and Rhythm: Normal rate.  Pulmonary:     Effort: Pulmonary effort is normal. No respiratory distress.  Abdominal:     General: There is no distension.  Genitourinary:    Comments: Left lower gluteal area with scarring present from prior I&D, mild surrounding induration, no overlying skin peeling or thinning, no extension or tenderness towards rectum Musculoskeletal:        General: Normal range of motion.     Cervical back: Neck supple.  Skin:    General: Skin is warm and dry.  Neurological:     Mental Status: She is alert and oriented to person, place, and time.      UC Treatments / Results  Labs (all labs ordered are listed, but only abnormal results are displayed) Labs Reviewed - No data to display  EKG   Radiology No results found.  Procedures Procedures (including critical care time)  Medications Ordered in UC Medications - No data to display  Initial Impression / Assessment and Plan / UC Course  I have reviewed the triage vital signs and the nursing notes.  Pertinent labs & imaging results that were available during my care of the patient were reviewed by me and considered in my medical decision making (see chart for details).     Left gluteal abscess, no obvious area of fluctuance to I&D at this time, opting to defer and placing on doxycycline recommending warm compresses and close monitoring.  Advised to follow-up within 3 days if not seeing any improvement or worsening despite starting antibiotics.    Discussed strict return precautions. Patient verbalized understanding and is agreeable with plan.  Final Clinical Impressions(s) / UC Diagnoses   Final diagnoses:  Perirectal abscess     Discharge Instructions     Begin doxycycline x10 days Tylenol and ibuprofen for pain May apply lidocaine cream to area Warm soaks in bathtub or warm compresses If not seeing any improvement over the next 3 to 4 days please follow-up    ED Prescriptions     Medication Sig Dispense Auth. Provider   doxycycline (VIBRAMYCIN) 100 MG capsule Take 1 capsule (100 mg total) by mouth 2 (two) times daily for 10 days. 20 capsule Torrion Witter C, PA-C   ibuprofen (ADVIL) 800 MG tablet  Take 1 tablet (800 mg total) by mouth 3 (three) times daily. 21 tablet Xoie Kreuser C, PA-C   lidocaine (LINDAMANTLE) 3 % CREA cream Apply 1 application topically as needed. 28 g Kyndell Zeiser, [place] C, PA-C     PDMP not reviewed this encounter.   Lew Dawes, [place] 03/29/21 1437

## 2021-03-29 NOTE — ED Triage Notes (Signed)
Pt presents with complaints of having a bump present on her buttocks. Reports she had an abscess there previously that she had to be treated for and have lanced open.

## 2021-04-03 ENCOUNTER — Telehealth: Payer: Self-pay | Admitting: Emergency Medicine

## 2021-04-03 MED ORDER — FLUCONAZOLE 150 MG PO TABS: 150.0000 mg | ORAL_TABLET | Freq: Every day | ORAL | 0 refills | Status: DC

## 2021-04-18 ENCOUNTER — Encounter: Payer: Self-pay | Admitting: Emergency Medicine

## 2021-04-18 ENCOUNTER — Ambulatory Visit
Admission: EM | Admit: 2021-04-18 | Discharge: 2021-04-18 | Disposition: A | Discharge status: Home / Self Care | Payer: Medicaid Other | Attending: Emergency Medicine | Admitting: Emergency Medicine

## 2021-04-18 ENCOUNTER — Other Ambulatory Visit: Payer: Self-pay

## 2021-04-18 DIAGNOSIS — Z20822 Contact with and (suspected) exposure to covid-19: Secondary | ICD-10-CM | POA: Diagnosis not present

## 2021-04-18 DIAGNOSIS — J209 Acute bronchitis, unspecified: Secondary | ICD-10-CM | POA: Diagnosis not present

## 2021-04-18 MED ORDER — PREDNISONE 20 MG PO TABS: 40.0000 mg | ORAL_TABLET | Freq: Every day | ORAL | 0 refills | Status: AC

## 2021-04-18 MED ORDER — AZITHROMYCIN 250 MG PO TABS: ORAL_TABLET | ORAL | 0 refills | Status: DC

## 2021-04-18 MED ORDER — DM-GUAIFENESIN ER 30-600 MG PO TB12: 1.0000 | ORAL_TABLET | Freq: Two times a day (BID) | ORAL | 0 refills | Status: DC

## 2021-04-18 MED ORDER — ALBUTEROL SULFATE HFA 108 (90 BASE) MCG/ACT IN AERS: 1.0000 | INHALATION_SPRAY | Freq: Four times a day (QID) | RESPIRATORY_TRACT | 0 refills | Status: DC | PRN

## 2021-04-18 MED ORDER — BENZONATATE 200 MG PO CAPS: 200.0000 mg | ORAL_CAPSULE | Freq: Three times a day (TID) | ORAL | 0 refills | Status: AC | PRN

## 2021-04-18 NOTE — ED Triage Notes (Signed)
Pt sts cough and fatigue x 1 week with fever x 1 day; pt requesting flu testing; pt sts home covid test was negative

## 2021-04-18 NOTE — ED Provider Notes (Signed)
[name]    CSN: [id number] Arrival date & time: 04/18/21  1309      History   Chief Complaint Chief Complaint  Patient presents with   Cough    HPI Brianna Dawson is a 25 y.o. female presenting today for evaluation of cough and fatigue.  Reports symptoms for approximately 1 week.  Reports fever x1 day early in course of symptoms, but denies any recent fevers.  Reports continued cough and congestion, throat discomfort.  Reports shortness of breath and wheezing.  HPI  Past Medical History:  Diagnosis Date   Medical history non-contributory     Patient Active Problem List   Diagnosis Date Noted   Presence of 52 mg levonorgestrel-releasing intrauterine device (IUD) 09/10/2020    Past Surgical History:  Procedure Laterality Date   NO PAST SURGERIES      OB History     Gravida  3   Para  3   Term  3   Preterm  0   AB  0   Living  3      SAB  0   IAB  0   Ectopic  0   Multiple  0   Live Births  3            Home Medications    Prior to Admission medications   Medication Sig Start Date End Date Taking? Authorizing Provider  albuterol (VENTOLIN HFA) 108 (90 Base) MCG/ACT inhaler Inhale 1-2 puffs into the lungs every 6 (six) hours as needed for wheezing or shortness of breath. 04/18/21  Yes Anntoinette Haefele C, PA-C  azithromycin (ZITHROMAX) 250 MG tablet Take first 2 tablets together, then 1 every day until finished. 04/18/21  Yes Kashonda Sarkisyan C, PA-C  benzonatate (TESSALON) 200 MG capsule Take 1 capsule (200 mg total) by mouth 3 (three) times daily as needed for up to 7 days for cough. 04/18/21 04/25/21 Yes Marcia Hartwell C, PA-C  dextromethorphan-guaiFENesin (MUCINEX DM) 30-600 MG 12hr tablet Take 1 tablet by mouth 2 (two) times daily. 04/18/21  Yes Durelle Zepeda C, PA-C  predniSONE (DELTASONE) 20 MG tablet Take 2 tablets (40 mg total) by mouth daily for 5 days. 04/18/21 04/23/21 Yes Ani Deoliveira C, PA-C  ibuprofen (ADVIL) 800 MG  tablet Take 1 tablet (800 mg total) by mouth 3 (three) times daily. 03/29/21   Coila Wardell C, PA-C  lidocaine (LINDAMANTLE) 3 % CREA cream Apply 1 application topically as needed. 03/29/21   Danise Dehne, Junius [place], PA-C    Family History Family History  Problem Relation Age of Onset   Hypertension Maternal Grandfather     Social History Social History   Tobacco Use   Smoking status: Some Days    Pack years: 0.00    Types: Cigars    Last attempt to quit: 01/22/2020    Years since quitting: 1.2   Smokeless tobacco: Never   Tobacco comments:    2 cigars a day  Vaping Use   Vaping Use: Never used  Substance Use Topics   Alcohol use: Not Currently    Comment: socially, occasionally   Drug use: Not Currently    Types: Marijuana    Comment: last time several years ago     Allergies   Patient has no known allergies.   Review of Systems Review of Systems  Constitutional:  Negative for activity change, appetite change, chills, fatigue and fever.  HENT:  Positive for congestion, rhinorrhea, sinus pressure and sore throat. Negative for ear  pain and trouble swallowing.   Eyes:  Negative for discharge and redness.  Respiratory:  Positive for cough. Negative for chest tightness and shortness of breath.   Cardiovascular:  Negative for chest pain.  Gastrointestinal:  Negative for abdominal pain, diarrhea, nausea and vomiting.  Musculoskeletal:  Negative for myalgias.  Skin:  Negative for rash.  Neurological:  Negative for dizziness, light-headedness and headaches.    Physical Exam Triage Vital Signs ED Triage Vitals  Enc Vitals Group     BP 04/18/21 1320 110/76     Pulse Rate 04/18/21 1320 (!) 102     Resp 04/18/21 1320 18     Temp 04/18/21 1320 98.9 F (37.2 C)     Temp Source 04/18/21 1320 Oral     SpO2 04/18/21 1320 95 %     Weight --      Height --      Head Circumference --      Peak Flow --      Pain Score 04/18/21 1321 7     Pain Loc --      Pain Edu? --       Excl. in GC? --    No data found.  Updated Vital Signs BP 110/76 (BP Location: Left Arm)   Pulse (!) 102   Temp 98.9 F (37.2 C) (Oral)   Resp 18   SpO2 95%   Visual Acuity Right Eye Distance:   Left Eye Distance:   Bilateral Distance:    Right Eye Near:   Left Eye Near:    Bilateral Near:     Physical Exam Vitals and nursing note reviewed.  Constitutional:      Appearance: She is well-developed.     Comments: No acute distress  HENT:     Head: Normocephalic and atraumatic.     Ears:     Comments: Bilateral ears without tenderness to palpation of external auricle, tragus and mastoid, EAC's without erythema or swelling, TM's with good bony landmarks and cone of light. Non erythematous.      Nose: Nose normal.     Mouth/Throat:     Comments: Oral mucosa pink and moist, no tonsillar enlargement or exudate. Posterior pharynx patent and nonerythematous, no uvula deviation or swelling. Normal phonation.  Eyes:     Conjunctiva/sclera: Conjunctivae normal.  Cardiovascular:     Rate and Rhythm: Normal rate.  Pulmonary:     Effort: Pulmonary effort is normal. No respiratory distress.     Comments: Breathing comfortably at rest, bilateral lung fields with wheezing and rhonchi noted through out Abdominal:     General: There is no distension.  Musculoskeletal:        General: Normal range of motion.     Cervical back: Neck supple.  Skin:    General: Skin is warm and dry.  Neurological:     Mental Status: She is alert and oriented to person, place, and time.     UC Treatments / Results  Labs (all labs ordered are listed, but only abnormal results are displayed) Labs Reviewed  COVID-19, FLU A+B NAA    EKG   Radiology No results found.  Procedures Procedures (including critical care time)  Medications Ordered in UC Medications - No data to display  Initial Impression / Assessment and Plan / UC Course  I have reviewed the triage vital signs and the nursing  notes.  Pertinent labs & imaging results that were available during my care of the patient were reviewed by me  and considered in my medical decision making (see chart for details).     Treating for bronchitis-COVID test pending, initiating on prednisone and albuterol, given symptoms over 1 week also placing on azithromycin course, continue symptomatic and supportive care of cough congestion.  Recommendations provided.  Continue to monitor,Discussed strict return precautions. Patient verbalized understanding and is agreeable with plan.  Final Clinical Impressions(s) / UC Diagnoses   Final diagnoses:  Encounter for screening laboratory testing for COVID-19 virus  Acute bronchitis, unspecified organism     Discharge Instructions      I am treating you for bronchitis Begin Z-Pak Prednisone daily x5 days with food Albuterol inhaler 1 to 2 puffs every 4-6 hours as needed for shortness of breath, chest tightness and wheezing May use Mucinex DM to further help with congestion and cough Tessalon every 8 hours for cough Rest and fluids Follow-up if not improving or worsening     ED Prescriptions     Medication Sig Dispense Auth. Provider   azithromycin (ZITHROMAX) 250 MG tablet Take first 2 tablets together, then 1 every day until finished. 6 tablet Nakima Fluegge C, PA-C   predniSONE (DELTASONE) 20 MG tablet Take 2 tablets (40 mg total) by mouth daily for 5 days. 10 tablet Demari Gales C, PA-C   albuterol (VENTOLIN HFA) 108 (90 Base) MCG/ACT inhaler Inhale 1-2 puffs into the lungs every 6 (six) hours as needed for wheezing or shortness of breath. 1 each Twilla Khouri C, PA-C   benzonatate (TESSALON) 200 MG capsule Take 1 capsule (200 mg total) by mouth 3 (three) times daily as needed for up to 7 days for cough. 28 capsule Raven Harmes C, PA-C   dextromethorphan-guaiFENesin (MUCINEX DM) 30-600 MG 12hr tablet Take 1 tablet by mouth 2 (two) times daily. 15 tablet Cleston Lautner, Lynn C,  PA-C      PDMP not reviewed this encounter.   Lew Dawes, PA-C 04/18/21 1353

## 2021-04-18 NOTE — Discharge Instructions (Addendum)
I am treating you for bronchitis Begin Z-Pak Prednisone daily x5 days with food Albuterol inhaler 1 to 2 puffs every 4-6 hours as needed for shortness of breath, chest tightness and wheezing May use Mucinex DM to further help with congestion and cough Tessalon every 8 hours for cough Rest and fluids Follow-up if not improving or worsening

## 2021-04-19 LAB — COVID-19, FLU A+B NAA
Influenza A, NAA: NOT DETECTED
Influenza B, NAA: NOT DETECTED
SARS-CoV-2, NAA: NOT DETECTED

## 2021-10-06 ENCOUNTER — Ambulatory Visit
Admission: EM | Admit: 2021-10-06 | Discharge: 2021-10-06 | Discharge status: Against Medical Advice | Payer: Medicaid Other

## 2021-10-25 ENCOUNTER — Other Ambulatory Visit: Payer: Self-pay

## 2021-10-25 ENCOUNTER — Ambulatory Visit
Admission: RE | Admit: 2021-10-25 | Discharge: 2021-10-25 | Disposition: A | Discharge status: Home / Self Care | Payer: Medicaid Other | Source: Ambulatory Visit | Attending: Physician Assistant | Admitting: Physician Assistant

## 2021-10-25 VITALS — BP 111/78 | HR 88 | Temp 98.2°F | Resp 18

## 2021-10-25 DIAGNOSIS — L739 Follicular disorder, unspecified: Secondary | ICD-10-CM | POA: Insufficient documentation

## 2021-10-25 DIAGNOSIS — Z113 Encounter for screening for infections with a predominantly sexual mode of transmission: Secondary | ICD-10-CM | POA: Diagnosis not present

## 2021-10-25 LAB — POCT URINALYSIS DIP (MANUAL ENTRY)
Bilirubin, UA: NEGATIVE
Blood, UA: NEGATIVE
Glucose, UA: NEGATIVE mg/dL
Leukocytes, UA: NEGATIVE
Nitrite, UA: NEGATIVE
Protein Ur, POC: NEGATIVE mg/dL
Spec Grav, UA: >=1.03 — AB (ref 1.010–1.025)
Urobilinogen, UA: 0.2 E.U./dL
pH, UA: 6.5 (ref 5.0–8.0)

## 2021-10-25 MED ORDER — DOXYCYCLINE HYCLATE 100 MG PO CAPS: 100.0000 mg | ORAL_CAPSULE | Freq: Two times a day (BID) | ORAL | 0 refills | Status: DC

## 2021-10-25 NOTE — ED Provider Notes (Signed)
[name]    CSN: [id number] Arrival date & time: 10/25/21  [id number]      History   Chief Complaint Chief Complaint  Patient presents with   10a appointment   Vaginal Discharge    HPI Brianna Dawson is a 25 y.o. female.   Patient here today for evaluation of discharge she has had the last 2 weeks.  She states that she does have some itching as well with intermittent dysuria.  She reports that showering aggravates symptoms.  She has not had any vaginal odor.  She denies any hematuria, urinary frequency or urgency.  She has not taken any treatment for symptoms.  She does report some genital lesions that are similar to prior abscesses, and requests treatment for same.  She denies any known STD exposures but would like to be screened.  The history is provided by the patient.  Vaginal Discharge Associated symptoms: dysuria   Associated symptoms: no abdominal pain, no fever, no nausea and no vomiting    Past Medical History:  Diagnosis Date   Medical history non-contributory     Patient Active Problem List   Diagnosis Date Noted   Presence of 52 mg levonorgestrel-releasing intrauterine device (IUD) 09/10/2020    Past Surgical History:  Procedure Laterality Date   NO PAST SURGERIES      OB History     Gravida  3   Para  3   Term  3   Preterm  0   AB  0   Living  3      SAB  0   IAB  0   Ectopic  0   Multiple  0   Live Births  3            Home Medications    Prior to Admission medications   Medication Sig Start Date End Date Taking? Authorizing Provider  doxycycline (VIBRAMYCIN) 100 MG capsule Take 1 capsule (100 mg total) by mouth 2 (two) times daily. 10/25/21  Yes Francene Finders, PA-C  albuterol (VENTOLIN HFA) 108 (90 Base) MCG/ACT inhaler Inhale 1-2 puffs into the lungs every 6 (six) hours as needed for wheezing or shortness of breath. 04/18/21   Wieters, Hallie C, PA-C  azithromycin (ZITHROMAX) 250 MG tablet Take first 2 tablets  together, then 1 every day until finished. 04/18/21   Wieters, Hallie C, PA-C  dextromethorphan-guaiFENesin (MUCINEX DM) 30-600 MG 12hr tablet Take 1 tablet by mouth 2 (two) times daily. 04/18/21   Wieters, Hallie C, PA-C  ibuprofen (ADVIL) 800 MG tablet Take 1 tablet (800 mg total) by mouth 3 (three) times daily. 03/29/21   Wieters, Hallie C, PA-C  lidocaine (LINDAMANTLE) 3 % CREA cream Apply 1 application topically as needed. 03/29/21   Wieters, Elesa Hacker, PA-C    Family History Family History  Problem Relation Age of Onset   Hypertension Maternal Grandfather     Social History Social History   Tobacco Use   Smoking status: Some Days    Types: Cigars    Last attempt to quit: 01/22/2020    Years since quitting: 1.7   Smokeless tobacco: Never   Tobacco comments:    2 cigars a day  Vaping Use   Vaping Use: Never used  Substance Use Topics   Alcohol use: Not Currently    Comment: socially, occasionally   Drug use: Not Currently    Types: Marijuana    Comment: last time several years ago     Allergies  Patient has no known allergies.   Review of Systems Review of Systems  Constitutional:  Negative for chills and fever.  Eyes:  Negative for discharge and redness.  Respiratory:  Negative for shortness of breath.   Gastrointestinal:  Negative for abdominal pain, nausea and vomiting.  Genitourinary:  Positive for dysuria, vaginal bleeding and vaginal discharge. Negative for frequency and hematuria.    Physical Exam Triage Vital Signs ED Triage Vitals  Enc Vitals Group     BP 10/25/21 1023 111/78     Pulse Rate 10/25/21 1023 88     Resp 10/25/21 1023 18     Temp 10/25/21 1023 98.2 F (36.8 C)     Temp Source 10/25/21 1023 Oral     SpO2 10/25/21 1023 96 %     Weight --      Height --      Head Circumference --      Peak Flow --      Pain Score 10/25/21 1025 0     Pain Loc --      Pain Edu? --      Excl. in GC? --    No data found.  Updated Vital Signs BP 111/78  (BP Location: Left Arm)    Pulse 88    Temp 98.2 F (36.8 C) (Oral)    Resp 18    SpO2 96%    Breastfeeding No       Physical Exam Vitals and nursing note reviewed.  Constitutional:      General: She is not in acute distress.    Appearance: Normal appearance. She is not ill-appearing.  HENT:     Head: Normocephalic and atraumatic.  Eyes:     Conjunctiva/sclera: Conjunctivae normal.  Cardiovascular:     Rate and Rhythm: Normal rate.  Pulmonary:     Effort: Pulmonary effort is normal.  Neurological:     Mental Status: She is alert.  Psychiatric:        Mood and Affect: Mood normal.        Behavior: Behavior normal.        Thought Content: Thought content normal.     UC Treatments / Results  Labs (all labs ordered are listed, but only abnormal results are displayed) Labs Reviewed  POCT URINALYSIS DIP (MANUAL ENTRY) - Abnormal; Notable for the following components:      Result Value   Ketones, POC UA trace (5) (*)    Spec Grav, UA >=1.030 (*)    All other components within normal limits  HIV ANTIBODY (ROUTINE TESTING W REFLEX)  HEPATITIS PANEL, ACUTE  RPR  CERVICOVAGINAL ANCILLARY ONLY    EKG   Radiology No results found.  Procedures Procedures (including critical care time)  Medications Ordered in UC Medications - No data to display  Initial Impression / Assessment and Plan / UC Course  I have reviewed the triage vital signs and the nursing notes.  Pertinent labs & imaging results that were available during my care of the patient were reviewed by me and considered in my medical decision making (see chart for details).    Doxycycline prescribed for suspected folliculitis.  STD screening ordered as requested.  Will await results for further recommendation.  Encouraged follow-up with any further concerns.  Final Clinical Impressions(s) / UC Diagnoses   Final diagnoses:  Screening for STD (sexually transmitted disease)  Folliculitis   Discharge  Instructions   None    ED Prescriptions     Medication Sig Dispense  Auth. Provider   doxycycline (VIBRAMYCIN) 100 MG capsule Take 1 capsule (100 mg total) by mouth 2 (two) times daily. 20 capsule Francene Finders, PA-C      PDMP not reviewed this encounter.   Francene Finders, PA-C 10/25/21 1113

## 2021-10-25 NOTE — ED Triage Notes (Signed)
2 week h/o vaginal discharge and itching with intermittent dysuria. Showering aggravates sxs. Pt notes some lesions that she thinks may be related shaving. Denies vaginal odor. No meds taken. No hematuria, urinary frequency or urgency. No known STD exposures, but Pt would like to be tested.

## 2021-10-26 LAB — RPR: RPR Ser Ql: NONREACTIVE

## 2021-10-26 LAB — HIV ANTIBODY (ROUTINE TESTING W REFLEX): HIV Screen 4th Generation wRfx: NONREACTIVE

## 2021-11-01 LAB — CERVICOVAGINAL ANCILLARY ONLY

## 2021-11-07 ENCOUNTER — Ambulatory Visit
Admission: EM | Admit: 2021-11-07 | Discharge: 2021-11-07 | Disposition: A | Discharge status: Home / Self Care | Payer: Medicaid Other | Attending: Internal Medicine | Admitting: Internal Medicine

## 2021-11-07 ENCOUNTER — Encounter: Payer: Self-pay | Admitting: Emergency Medicine

## 2021-11-07 DIAGNOSIS — Z113 Encounter for screening for infections with a predominantly sexual mode of transmission: Secondary | ICD-10-CM | POA: Diagnosis not present

## 2021-11-07 NOTE — ED Triage Notes (Signed)
Here for vaginal re-swab from prior visit

## 2021-11-08 ENCOUNTER — Telehealth (HOSPITAL_COMMUNITY): Payer: Self-pay | Admitting: Emergency Medicine

## 2021-11-08 LAB — CERVICOVAGINAL ANCILLARY ONLY
Bacterial Vaginitis (gardnerella): NEGATIVE
Candida Glabrata: NEGATIVE
Candida Vaginitis: POSITIVE — AB
Chlamydia: NEGATIVE
Comment: NEGATIVE
Comment: NEGATIVE
Comment: NEGATIVE
Comment: NEGATIVE
Comment: NEGATIVE
Comment: NORMAL
Neisseria Gonorrhea: NEGATIVE
Trichomonas: NEGATIVE

## 2021-11-08 MED ORDER — FLUCONAZOLE 150 MG PO TABS: 150.0000 mg | ORAL_TABLET | Freq: Once | ORAL | 0 refills | Status: AC

## 2022-09-06 ENCOUNTER — Emergency Department (HOSPITAL_COMMUNITY): Payer: Medicaid Other

## 2022-09-06 ENCOUNTER — Emergency Department (HOSPITAL_COMMUNITY)
Admission: EM | Admit: 2022-09-06 | Discharge: 2022-09-07 | Discharge status: Against Medical Advice | Payer: Medicaid Other | Attending: Emergency Medicine | Admitting: Emergency Medicine

## 2022-09-06 ENCOUNTER — Encounter (HOSPITAL_COMMUNITY): Payer: Self-pay | Admitting: Emergency Medicine

## 2022-09-06 DIAGNOSIS — Z5321 Procedure and treatment not carried out due to patient leaving prior to being seen by health care provider: Secondary | ICD-10-CM | POA: Insufficient documentation

## 2022-09-06 DIAGNOSIS — R519 Headache, unspecified: Secondary | ICD-10-CM | POA: Diagnosis present

## 2022-09-06 DIAGNOSIS — M549 Dorsalgia, unspecified: Secondary | ICD-10-CM | POA: Insufficient documentation

## 2022-09-06 NOTE — ED Triage Notes (Addendum)
Pt reported toED with c/o frequent headaches x4 months. Pt states she has to take tylenol daily and may have multiple headaches a day. Endorses pain that starts in back of head and radiates into left eye. Denies any pain at this time.

## 2022-09-06 NOTE — ED Provider Triage Note (Signed)
Emergency Medicine Provider Triage Evaluation Note  Brianna Dawson , a 26 y.o. female  was evaluated in triage.  Pt complains of headaches ongoing for 4 months.  Headaches are daily.  She has been taking Tylenol and ibuprofen with temporary relief but pain returns.  Pain starts in the back of the head and then radiates to the left eye area.  Sometimes her eye tears.  She has associated light sensitivity.  No vomiting.  No weakness, numbness, tingling of the arms of the legs.  No head injuries.  States that she came in tonight at the urging of family members because she has not been evaluated for headaches.  Review of Systems  Positive: Headache Negative: Vomiting  Physical Exam  BP 134/82 (BP Location: Right Arm)   Pulse (!) 108   Temp 98.6 F (37 C) (Oral)   Resp 15   SpO2 97%  Gen:   Awake, no distress   Resp:  Normal effort  MSK:   Moves extremities without difficulty  Other:    Medical Decision Making  Medically screening exam initiated at 9:40 PM.  Appropriate orders placed.  Brianna Dawson was informed that the remainder of the evaluation will be completed by another provider, this initial triage assessment does not replace that evaluation, and the importance of remaining in the ED until their evaluation is complete.     Brianna Cater, PA-C 09/06/22 2141

## 2022-09-07 NOTE — ED Notes (Signed)
NA x3 vitals 

## 2024-03-12 ENCOUNTER — Ambulatory Visit
Admission: EM | Admit: 2024-03-12 | Discharge: 2024-03-12 | Disposition: A | Discharge status: Home / Self Care | Payer: Self-pay | Attending: Internal Medicine | Admitting: Internal Medicine

## 2024-03-12 ENCOUNTER — Other Ambulatory Visit: Payer: Self-pay

## 2024-03-12 DIAGNOSIS — J40 Bronchitis, not specified as acute or chronic: Secondary | ICD-10-CM

## 2024-03-12 DIAGNOSIS — Z720 Tobacco use: Secondary | ICD-10-CM

## 2024-03-12 DIAGNOSIS — R221 Localized swelling, mass and lump, neck: Secondary | ICD-10-CM

## 2024-03-12 LAB — POCT URINE PREGNANCY: Preg Test, Ur: NEGATIVE

## 2024-03-12 MED ORDER — PREDNISONE 20 MG PO TABS: 40.0000 mg | ORAL_TABLET | Freq: Every day | ORAL | 0 refills | Status: AC

## 2024-03-12 MED ORDER — ALBUTEROL SULFATE HFA 108 (90 BASE) MCG/ACT IN AERS: 1.0000 | INHALATION_SPRAY | Freq: Four times a day (QID) | RESPIRATORY_TRACT | 0 refills | Status: AC | PRN

## 2024-03-12 NOTE — ED Triage Notes (Signed)
 Pt reports uvula swollen x 3-4 days- denies illness, allergies

## 2024-03-12 NOTE — ED Provider Notes (Addendum)
 [name]    CSN: [id number] Arrival date & time: 03/12/24  5621      History   Chief Complaint Chief Complaint  Patient presents with   Oral Swelling    uvula    HPI Brianna Dawson is a 28 y.o. female.   28 year old female who presents urgent care with complaints of a swollen uvula.  She was started noticing this about 3 to 4 days ago.  She denies any injury to the area that she knows of.  She denies any difficulty swallowing, fevers, chills, congestion, shortness of breath, chest pain, sore throat.  This has happened in the past and at that time she was pregnant.  She has not had a period since 4/11.  She denies any nausea or vomiting.  She has not had any sick exposures that she knows of.  She is a smoker and reports having an intermittent cough on a regular basis but no increasing coughing recently.  She is requesting a pregnancy test.     Past Medical History:  Diagnosis Date   Medical history non-contributory     Patient Active Problem List   Diagnosis Date Noted   Presence of 52 mg levonorgestrel -releasing intrauterine device (IUD) 09/10/2020    Past Surgical History:  Procedure Laterality Date   NO PAST SURGERIES      OB History     Gravida  3   Para  3   Term  3   Preterm  0   AB  0   Living  3      SAB  0   IAB  0   Ectopic  0   Multiple  0   Live Births  3            Home Medications    Prior to Admission medications   Medication Sig Start Date End Date Taking? Authorizing Provider  predniSONE  (DELTASONE ) 20 MG tablet Take 2 tablets (40 mg total) by mouth daily with breakfast for 5 days. 03/12/24 03/17/24 Yes Ambriella Kitt A, PA-C  albuterol  (VENTOLIN  HFA) 108 (90 Base) MCG/ACT inhaler Inhale 1-2 puffs into the lungs every 6 (six) hours as needed for wheezing or shortness of breath. 03/12/24   Pearley Baranek A, PA-C  azithromycin  (ZITHROMAX ) 250 MG tablet Take first 2 tablets together, then 1 every day until  finished. Patient not taking: Reported on 03/12/2024 04/18/21   Wieters, Hallie C, PA-C  dextromethorphan-guaiFENesin  (MUCINEX  DM) 30-600 MG 12hr tablet Take 1 tablet by mouth 2 (two) times daily. Patient not taking: Reported on 03/12/2024 04/18/21   Wieters, Hallie C, PA-C  doxycycline  (VIBRAMYCIN ) 100 MG capsule Take 1 capsule (100 mg total) by mouth 2 (two) times daily. Patient not taking: Reported on 03/12/2024 10/25/21   Vernestine Gondola, PA-C  ibuprofen  (ADVIL ) 800 MG tablet Take 1 tablet (800 mg total) by mouth 3 (three) times daily. Patient not taking: Reported on 03/12/2024 03/29/21   Wieters, Hallie C, PA-C  lidocaine  (LINDAMANTLE) 3 % CREA cream Apply 1 application topically as needed. Patient not taking: Reported on 03/12/2024 03/29/21   Wieters, Hallie C, PA-C    Family History Family History  Problem Relation Age of Onset   Hypertension Maternal Grandfather     Social History Social History   Tobacco Use   Smoking status: Some Days    Types: Cigars    Last attempt to quit: 01/22/2020    Years since quitting: 4.1   Smokeless tobacco: Never   Tobacco  comments:    2 cigars a day  Vaping Use   Vaping status: Never Used  Substance Use Topics   Alcohol use: Not Currently    Comment: socially, occasionally   Drug use: Not Currently    Types: Marijuana    Comment: last time several years ago     Allergies   Patient has no known allergies.   Review of Systems Review of Systems  Constitutional:  Negative for chills and fever.  HENT:  Negative for ear pain and sore throat.        Mildly swelling uvula  Eyes:  Negative for pain and visual disturbance.  Respiratory:  Negative for cough and shortness of breath.   Cardiovascular:  Negative for chest pain and palpitations.  Gastrointestinal:  Negative for abdominal pain and vomiting.  Genitourinary:  Negative for dysuria and hematuria.  Musculoskeletal:  Negative for arthralgias and back pain.  Skin:  Negative for color  change and rash.  Neurological:  Negative for seizures and syncope.  All other systems reviewed and are negative.    Physical Exam Triage Vital Signs ED Triage Vitals  Encounter Vitals Group     BP 03/12/24 0840 106/70     Systolic BP Percentile --      Diastolic BP Percentile --      Pulse Rate 03/12/24 0840 85     Resp 03/12/24 0840 16     Temp 03/12/24 0840 98 F (36.7 C)     Temp Source 03/12/24 0840 Oral     SpO2 03/12/24 0840 98 %     Weight --      Height --      Head Circumference --      Peak Flow --      Pain Score 03/12/24 0841 0     Pain Loc --      Pain Education --      Exclude from Growth Chart --    No data found.  Updated Vital Signs BP 106/70 (BP Location: Right Arm)   Pulse 85   Temp 98 F (36.7 C) (Oral)   Resp 16   LMP 02/12/2024   SpO2 98%   Visual Acuity Right Eye Distance:   Left Eye Distance:   Bilateral Distance:    Right Eye Near:   Left Eye Near:    Bilateral Near:     Physical Exam Vitals and nursing note reviewed.  Constitutional:      General: She is not in acute distress.    Appearance: She is well-developed.  HENT:     Head: Normocephalic and atraumatic.     Right Ear: Tympanic membrane normal.     Left Ear: Tympanic membrane normal.     Mouth/Throat:     Mouth: Mucous membranes are moist.     Pharynx: Posterior oropharyngeal erythema (Mild on the uvula) and uvula swelling (Very mild) present.  Eyes:     Conjunctiva/sclera: Conjunctivae normal.  Cardiovascular:     Rate and Rhythm: Normal rate and regular rhythm.     Heart sounds: No murmur heard. Pulmonary:     Effort: Pulmonary effort is normal. No respiratory distress.     Breath sounds: Examination of the right-upper field reveals wheezing. Examination of the left-upper field reveals wheezing. Wheezing present. No decreased breath sounds.  Abdominal:     Palpations: Abdomen is soft.     Tenderness: There is no abdominal tenderness.  Musculoskeletal:         General: No  swelling.     Cervical back: Neck supple.  Skin:    General: Skin is warm and dry.     Capillary Refill: Capillary refill takes less than 2 seconds.  Neurological:     General: No focal deficit present.     Mental Status: She is alert.  Psychiatric:        Mood and Affect: Mood normal.      UC Treatments / Results  Labs (all labs ordered are listed, but only abnormal results are displayed) Labs Reviewed  POCT URINE PREGNANCY - Normal    EKG   Radiology No results found.  Procedures Procedures (including critical care time)  Medications Ordered in UC Medications - No data to display  Initial Impression / Assessment and Plan / UC Course  I have reviewed the triage vital signs and the nursing notes.  Pertinent labs & imaging results that were available during my care of the patient were reviewed by me and considered in my medical decision making (see chart for details).     Swollen uvula  Tobacco user  Bronchitis   The patient has some mild swelling of the uvula but no infectious symptoms are present and the uvula does not appear infectious.  Her lung exam does reveal some wheezing but the patient is a chronic smoker.  She denies any fevers, cough, shortness of breath, chest pain.  Offered chest x-ray to further evaluate this but the patient declined.  This may be an aspect of a mild bronchitis along with the uvula swelling.  We will treat with prednisone  40 mg daily for 5 days and advised the patient take this in the morning with food.  We will also send an albuterol  inhaler in case there is an increase in wheezing.  We discussed that if her symptoms worsen at all especially if she develops cough, shortness of breath, chest pain, fevers then she would need to return to urgent care as this may indicate that she needs to pursue the chest x-ray.  The patient expresses understanding.  Final Clinical Impressions(s) / UC Diagnoses   Final diagnoses:  Swollen  uvula  Tobacco user  Bronchitis     Discharge Instructions      Pregnancy test was negative.  Swelling of the uvula but without other symptoms.  This does not appear to be infectious in nature.  No fevers, trouble swallowing, shortness of breath. This is likely from irritation of the uvula. There is some mild wheezing which likely is an aspect of bronchitis without any other concerning symptoms. CXR deferred today but if symptoms worsen, then you may need a chest x-ray. We will treat with the following:  Prednisone  40 mg (2 tablets) once daily for 5 days. Take this in the morning.  This is a steroid to help with inflammation and pain.  Albuterol  inhaler 1-2 puffs every 6 hours as needed for wheezing/shortness of breath.  If you develop fevers, chills, shortness of breath, chest pain, cough or any other worsening symptoms then recommend returning to urgent care as you may need to pursue a chest x-ray   ED Prescriptions     Medication Sig Dispense Auth. Provider   predniSONE  (DELTASONE ) 20 MG tablet Take 2 tablets (40 mg total) by mouth daily with breakfast for 5 days. 10 tablet Lorenzo Romberg A, PA-C   albuterol  (VENTOLIN  HFA) 108 (90 Base) MCG/ACT inhaler Inhale 1-2 puffs into the lungs every 6 (six) hours as needed for wheezing or shortness of breath. 1  each Kreg Pesa, PA-C      PDMP not reviewed this encounter.   Kreg Pesa, PA-C 03/12/24 0915    Kreg Pesa, PA-C 03/12/24 [phone]

## 2024-03-12 NOTE — Discharge Instructions (Addendum)
 Pregnancy test was negative.  Swelling of the uvula but without other symptoms.  This does not appear to be infectious in nature.  No fevers, trouble swallowing, shortness of breath. This is likely from irritation of the uvula. There is some mild wheezing which likely is an aspect of bronchitis without any other concerning symptoms. CXR deferred today but if symptoms worsen, then you may need a chest x-ray. We will treat with the following:  Prednisone  40 mg (2 tablets) once daily for 5 days. Take this in the morning.  This is a steroid to help with inflammation and pain.  Albuterol  inhaler 1-2 puffs every 6 hours as needed for wheezing/shortness of breath.  If you develop fevers, chills, shortness of breath, chest pain, cough or any other worsening symptoms then recommend returning to urgent care as you may need to pursue a chest x-ray

## 2024-06-21 ENCOUNTER — Other Ambulatory Visit: Payer: Self-pay

## 2024-06-21 ENCOUNTER — Emergency Department (HOSPITAL_COMMUNITY)
Admission: EM | Admit: 2024-06-21 | Discharge: 2024-06-21 | Disposition: A | Discharge status: Home / Self Care | Payer: Self-pay | Attending: Emergency Medicine | Admitting: Emergency Medicine

## 2024-06-21 DIAGNOSIS — G43809 Other migraine, not intractable, without status migrainosus: Secondary | ICD-10-CM | POA: Insufficient documentation

## 2024-06-21 LAB — CBC WITH DIFFERENTIAL/PLATELET
Abs Immature Granulocytes: 0.02 K/uL (ref 0.00–0.07)
Basophils Absolute: 0 K/uL (ref 0.0–0.1)
Basophils Relative: 1 %
Eosinophils Absolute: 0.1 K/uL (ref 0.0–0.5)
Eosinophils Relative: 1 %
HCT: 37.2 % (ref 36.0–46.0)
Hemoglobin: 12.7 g/dL (ref 12.0–15.0)
Immature Granulocytes: 0 %
Lymphocytes Relative: 33 %
Lymphs Abs: 2.3 K/uL (ref 0.7–4.0)
MCH: 25.8 pg — ABNORMAL LOW (ref 26.0–34.0)
MCHC: 34.1 g/dL (ref 30.0–36.0)
MCV: 75.5 fL — ABNORMAL LOW (ref 80.0–100.0)
Monocytes Absolute: 0.7 K/uL (ref 0.1–1.0)
Monocytes Relative: 10 %
Neutro Abs: 3.9 K/uL (ref 1.7–7.7)
Neutrophils Relative %: 55 %
Platelets: 172 K/uL (ref 150–400)
RBC: 4.93 MIL/uL (ref 3.87–5.11)
RDW: 14.3 % (ref 11.5–15.5)
WBC: 7 K/uL (ref 4.0–10.5)
nRBC: 0 % (ref 0.0–0.2)

## 2024-06-21 LAB — PROTIME-INR
INR: 1.1 (ref 0.8–1.2)
Prothrombin Time: 15.3 s — ABNORMAL HIGH (ref 11.4–15.2)

## 2024-06-21 LAB — COMPREHENSIVE METABOLIC PANEL WITH GFR
ALT: 10 U/L (ref 0–44)
AST: 17 U/L (ref 15–41)
Albumin: 3.7 g/dL (ref 3.5–5.0)
Alkaline Phosphatase: 44 U/L (ref 38–126)
Anion gap: 11 (ref 5–15)
BUN: 6 mg/dL (ref 6–20)
CO2: 20 mmol/L — ABNORMAL LOW (ref 22–32)
Calcium: 8.6 mg/dL — ABNORMAL LOW (ref 8.9–10.3)
Chloride: 106 mmol/L (ref 98–111)
Creatinine, Ser: 0.79 mg/dL (ref 0.44–1.00)
GFR, Estimated: >60 mL/min (ref >60)
Glucose, Bld: 86 mg/dL (ref 70–99)
Potassium: 3.7 mmol/L (ref 3.5–5.1)
Sodium: 137 mmol/L (ref 135–145)
Total Bilirubin: 0.4 mg/dL (ref 0.0–1.2)
Total Protein: 6.6 g/dL (ref 6.5–8.1)

## 2024-06-21 MED ORDER — SUMATRIPTAN SUCCINATE 50 MG PO TABS: 50.0000 mg | ORAL_TABLET | ORAL | 0 refills | Status: AC | PRN

## 2024-06-21 NOTE — Discharge Instructions (Signed)
 Decrease the amount of Goody headache powders that you are using.

## 2024-06-21 NOTE — ED Triage Notes (Signed)
 Pt. Stated, I have a headache everyday for the last year and will take  8-9 Goody powders everyday til it goes away . Yesterday I had some nausea, feeling lethargic and my hearing is muffled. I can hear but it seems muffled.

## 2024-06-21 NOTE — ED Provider Notes (Signed)
 [place] [facility] AT [facility] Provider Note   CSN: [id number] Arrival date & time: 06/21/24  9175     Patient presents with: Headache x 1 year   Mauri Temkin is a 28 y.o. female.   Pt complains of a headache for the past year.  Pt reports she takes approximally  8 Goody powders a day.  Pt reports she has a headache everyday.  Pt reports relief with medications.  Pt denies any visual changers. No hearing changes.  No weakness no dizziness.  Patient reports headaches are normally on the left side.  Patient states experiencing some blurring of vision on the left side when she has a headache.  Patient denies any current blurred vision.  Patient denies any fever or chills.  No past medical history of high blood pressure or diabetes.  Patient is not a smoker.  The history is provided by the patient. No language interpreter was used.       Prior to Admission medications   Medication Sig Start Date End Date Taking? Authorizing Provider  albuterol  (VENTOLIN  HFA) 108 (90 Base) MCG/ACT inhaler Inhale 1-2 puffs into the lungs every 6 (six) hours as needed for wheezing or shortness of breath. 03/12/24  Yes White, Elizabeth A, PA-C  Aspirin-Acetaminophen -Caffeine (GOODYS EXTRA STRENGTH PO) Take 1-2 Packages by mouth every 4 (four) hours as needed (Pain).   Yes [provider]  SUMAtriptan  (IMITREX ) 50 MG tablet Take 1 tablet (50 mg total) by mouth every 2 (two) hours as needed for migraine (May take up to 4 tablets a day). May repeat in 2 hours if headache persists or recurs. 06/21/24 06/22/25 Yes Pastor Sgro K, PA-C    Allergies: Patient has no known allergies.    Review of Systems  All other systems reviewed and are negative.   Updated Vital Signs BP 96/76 (BP Location: Left Arm)   Pulse 76   Temp 97.9 F (36.6 C) (Oral)   Resp 16   Ht 5' 6 (1.676 m)   Wt 72.6 kg   LMP 05/17/2024   SpO2 99%   BMI 25.82 kg/m   Physical Exam Vitals and nursing  note reviewed.  Constitutional:      Appearance: She is well-developed.  HENT:     Head: Normocephalic.     Right Ear: Tympanic membrane normal.     Left Ear: Tympanic membrane normal.     Mouth/Throat:     Mouth: Mucous membranes are moist.  Eyes:     Pupils: Pupils are equal, round, and reactive to light.  Cardiovascular:     Rate and Rhythm: Normal rate.  Pulmonary:     Effort: Pulmonary effort is normal.  Abdominal:     General: There is no distension.  Musculoskeletal:        General: Normal range of motion.     Cervical back: Normal range of motion.  Skin:    General: Skin is warm.  Neurological:     General: No focal deficit present.     Mental Status: She is alert and oriented to person, place, and time.  Psychiatric:        Mood and Affect: Mood normal.     (all labs ordered are listed, but only abnormal results are displayed) Labs Reviewed  CBC WITH DIFFERENTIAL/PLATELET - Abnormal; Notable for the following components:      Result Value   MCV 75.5 (*)    MCH 25.8 (*)    All other components within  normal limits  COMPREHENSIVE METABOLIC PANEL WITH GFR - Abnormal; Notable for the following components:   CO2 20 (*)    Calcium 8.6 (*)    All other components within normal limits  PROTIME-INR - Abnormal; Notable for the following components:   Prothrombin Time 15.3 (*)    All other components within normal limits    EKG: None  Radiology: No results found.   Procedures   Medications Ordered in the ED - No data to display                                  Medical Decision Making Pt complains of daily headaches that resolve with otc medications.  Pt reports taking goody powders multiple taimes a day  Amount and/or Complexity of Data Reviewed Labs: ordered. Decision-making details documented in ED Course.    Details: Labs ordered reviewed and interpreted.   Risk Prescription drug management. Risk Details: Pt counseled on decreasing use of Goody  powders.  Pt given rx for imitrex .          Final diagnoses:  Other migraine without status migrainosus, not intractable    ED Discharge Orders          Ordered    SUMAtriptan  (IMITREX ) 50 MG tablet  Every 2 hours PRN        06/21/24 1205            An After Visit Summary was printed and given to the patient.    Flint Sonny POUR, [place] 06/21/24 1442    Garrick Charleston, MD 06/21/24 [phone]

## 2024-06-30 ENCOUNTER — Encounter: Payer: Self-pay | Admitting: Neurology

## 2024-06-30 ENCOUNTER — Telehealth: Payer: Self-pay

## 2024-06-30 ENCOUNTER — Telehealth (HOSPITAL_BASED_OUTPATIENT_CLINIC_OR_DEPARTMENT_OTHER): Payer: Self-pay | Admitting: Physician Assistant

## 2024-06-30 MED ORDER — SUMATRIPTAN SUCCINATE 100 MG PO TABS: 100.0000 mg | ORAL_TABLET | ORAL | 0 refills | Status: DC | PRN

## 2024-06-30 NOTE — Telephone Encounter (Cosign Needed)
 Rx refill

## 2024-06-30 NOTE — Telephone Encounter (Signed)
 Patient called in to say she was seen on August 19th and ran out of medicine. She cannot get a neurology appointment until December 4th, 2025 and does not have a PCP encouraged her to look for a PCP. Messaged with sonny Sera, who will look at the chart upon coming in today at 1200.

## 2024-07-06 ENCOUNTER — Ambulatory Visit: Payer: Self-pay

## 2024-07-06 NOTE — Telephone Encounter (Signed)
 FYI Only or Action Required?: FYI only for provider.  Patient was last seen in primary care on N/A, new patient.  Called Nurse Triage reporting Headache.  Symptoms began 1 year ago.  Interventions attempted: OTC medications: Goody powder and Prescription medications: sumatriptan .  Symptoms are: daily headaches/migraines on left side of face, left eye visual changes with blurry vision and eye watering (not present at this time), left nostril drainage/runny, nausea, left eye pain (mild at this time) stable.  Triage Disposition: See PCP Within 2 Weeks- advised patient to be seen at mobile medicine clinic or urgent care in the meantime while waiting for new patient appt for sumatriptan  refill and advised to stop Goody powders (based on ED provider advice)  Patient/caregiver understands and will follow disposition?: Yes         Copied from CRM #[id number]. Topic: Clinical - Medical Advice >> Jul 06, 2024  9:19 AM Kathrin PARAS wrote: Reason for CRM: Patient is calling because she has been experiencing migraines every other day and wants to know if she should keep taking goody powder for it Reason for Disposition  Headache is a chronic symptom (recurrent or ongoing AND present > 4 weeks)  Answer Assessment - Initial Assessment Questions 1. LOCATION: Where does it hurt?      Left side of head.  2. ONSET: When did the headache start? (e.g., minutes, hours, days)      Daily for the past 1 year.  3. PATTERN: Does the pain come and go, or has it been constant since it started?     Comes and goes, she states it comes as often as every 2 hours. She states if she does not medicate herself the pain can last 4 hours.  4. SEVERITY: How bad is the pain? and What does it keep you from doing?  (e.g., Scale 1-10; mild, moderate, or severe)     She states her headaches are usually an 8/10. Right now she states her pain is 4/10 (treated with Goody powder).  5. RECURRENT SYMPTOM: Have you ever  had headaches before? If Yes, ask: When was the last time? and What happened that time?      Yes over the past year.  6. CAUSE: What do you think is causing the headache?     Stress; goody powders causing kickback migraines/headaches.  7. MIGRAINE: Have you been diagnosed with migraine headaches? If Yes, ask: Is this headache similar?      She states she has not; ED visit 06/21/24 diagnosed with migraines.  8. HEAD INJURY: Has there been any recent injury to your head?      No.  9. OTHER SYMPTOMS: Do you have any other symptoms? (e.g., fever, stiff neck, eye pain, sore throat, cold symptoms)     She states the pain sometimes will be severe enough that she starts sweating (if she has not taken anything for pain); she states her left side of her face is affected during the migraine with eye watering and nose running; she states she has blurry vision before it starts watering and then can't see out of it at all during the eye watering; nausea intermittent; nerve pain in left side when headache comes on. Denies fever, neck stiffness, changes in speech, unilateral numbness or weakness. She states at this time there is just a mild pain in left eye and no changes in vision in left eye at this time.  10. PREGNANCY: Is there any chance you are pregnant? When was your last  menstrual period?       LMP: 06/10/24.  Protocols used: [facility]

## 2024-07-12 ENCOUNTER — Ambulatory Visit: Payer: Self-pay

## 2024-07-16 ENCOUNTER — Ambulatory Visit
Admission: EM | Admit: 2024-07-16 | Discharge: 2024-07-16 | Disposition: A | Discharge status: Home / Self Care | Payer: Self-pay

## 2024-07-16 DIAGNOSIS — G43919 Migraine, unspecified, intractable, without status migrainosus: Secondary | ICD-10-CM

## 2024-07-16 MED ORDER — KETOROLAC TROMETHAMINE 30 MG/ML IJ SOLN
30.0000 mg | Freq: Once | INTRAMUSCULAR | Status: AC
  Administered 2024-07-16: 30 mg via INTRAMUSCULAR

## 2024-07-16 MED ORDER — SUMATRIPTAN SUCCINATE 100 MG PO TABS: 100.0000 mg | ORAL_TABLET | ORAL | 1 refills | Status: AC | PRN

## 2024-07-16 NOTE — ED Provider Notes (Signed)
 [facility]  Note:  This document was prepared using Conservation officer, historic buildings and may include unintentional dictation errors.  MRN: [id number] DOB: [birth year]  Subjective:   Brianna Dawson is a 28 y.o. female presenting for persistent, intractable migraine headaches x 1 year.  Patient reports that she has hide migraines almost every day.  Patient has follow-up appointment with neurology in December but ran out of her previously prescribed sumatriptan .  Patient would like refill of medication here in urgent care to help control her migraine headaches.  Patient reports some mild changes to vision, no photophobia, no nausea/vomiting, no neck pain, or altered mental status.  Patient denies taking any medication at home prior to arrival in urgent care.  No current facility-administered medications for this encounter.  Current Outpatient Medications:    UNABLE TO FIND, Med Name: Good Powder, Disp: , Rfl:    albuterol  (VENTOLIN  HFA) 108 (90 Base) MCG/ACT inhaler, Inhale 1-2 puffs into the lungs every 6 (six) hours as needed for wheezing or shortness of breath., Disp: 1 each, Rfl: 0   Aspirin-Acetaminophen -Caffeine (GOODYS EXTRA STRENGTH PO), Take 1-2 Packages by mouth every 4 (four) hours as needed (Pain)., Disp: , Rfl:    SUMAtriptan  (IMITREX ) 100 MG tablet, Take 1 tablet (100 mg total) by mouth every 2 (two) hours as needed for migraine. May repeat in 2 hours if headache persists or recurs. No more than 4 doses in 24 hrs., Disp: 20 tablet, Rfl: 1   SUMAtriptan  (IMITREX ) 50 MG tablet, Take 1 tablet (50 mg total) by mouth every 2 (two) hours as needed for migraine (May take up to 4 tablets a day). May repeat in 2 hours if headache persists or recurs., Disp: 30 tablet, Rfl: 0   No Known Allergies  Past Medical History:  Diagnosis Date   Medical history non-contributory      Past Surgical History:  Procedure Laterality Date   NO PAST SURGERIES      Family History  Problem  Relation Age of Onset   Hypertension Maternal Grandfather     Social History   Tobacco Use   Smoking status: Some Days    Types: Cigars    Last attempt to quit: 01/22/2020    Years since quitting: 4.4   Smokeless tobacco: Never   Tobacco comments:    2 cigars a day  Vaping Use   Vaping status: Never Used  Substance Use Topics   Alcohol use: Not Currently    Comment: socially, occasionally   Drug use: Not Currently    Types: Marijuana    Comment: last time several years ago    ROS Refer to HPI for ROS details.  Objective:   Vitals: BP 105/70 (BP Location: Right Arm)   Pulse 87   Temp 97.9 F (36.6 C) (Oral)   Resp 18   Ht 5' 6 (1.676 m)   Wt 160 lb (72.6 kg)   LMP 06/17/2024 (Exact Date)   SpO2 96%   BMI 25.82 kg/m   Physical Exam Vitals and nursing note reviewed.  Constitutional:      General: She is not in acute distress.    Appearance: She is well-developed. She is not ill-appearing or toxic-appearing.  HENT:     Head: Normocephalic and atraumatic.     Nose: Nose normal.     Mouth/Throat:     Mouth: Mucous membranes are moist.  Eyes:     General:        Right eye: No discharge.  Left eye: No discharge.     Extraocular Movements: Extraocular movements intact.     Conjunctiva/sclera: Conjunctivae normal.     Pupils: Pupils are equal, round, and reactive to light.  Cardiovascular:     Rate and Rhythm: Normal rate.  Pulmonary:     Effort: Pulmonary effort is normal. No respiratory distress.  Musculoskeletal:     Cervical back: Normal range of motion and neck supple. No rigidity or tenderness.  Skin:    General: Skin is warm and dry.  Neurological:     General: No focal deficit present.     Mental Status: She is alert and oriented to person, place, and time.     Cranial Nerves: No cranial nerve deficit.     Sensory: No sensory deficit.     Motor: No weakness.     Coordination: Coordination normal.     Gait: Gait normal.  Psychiatric:         Mood and Affect: Mood normal.        Behavior: Behavior normal.     Procedures  No results found for this or any previous visit (from the past 24 hours).  No results found.   Assessment and Plan :     Discharge Instructions       1. Intractable migraine without status migrainosus, unspecified migraine type (Primary) - SUMAtriptan  (IMITREX ) 100 MG tablet; Take 1 tablet (100 mg total) by mouth every 2 (two) hours as needed for migraine. May repeat in 2 hours if headache persists or recurs. No more than 4 doses in 24 hrs.  Dispense: 20 tablet; Refill: 1 - ketorolac  (TORADOL ) 30 MG/ML IM injection 30 mg given in UC for acute on chronic migraine headache. - Follow-up with neurology as scheduled in December.  If you run out of sumatriptan  prior to follow-up with neurology and are unable to see primary care physician for refill, follow-up in urgent care for reevaluation and refill of medication. -Continue to monitor symptoms for any change in severity if there is any escalation of current symptoms or development of new symptoms follow-up in ER for further evaluation and management.       [name]   [name], [name], [place] 07/16/24 [phone]

## 2024-07-16 NOTE — Discharge Instructions (Signed)
  1. Intractable migraine without status migrainosus, unspecified migraine type (Primary) - SUMAtriptan  (IMITREX ) 100 MG tablet; Take 1 tablet (100 mg total) by mouth every 2 (two) hours as needed for migraine. May repeat in 2 hours if headache persists or recurs. No more than 4 doses in 24 hrs.  Dispense: 20 tablet; Refill: 1 - ketorolac  (TORADOL ) 30 MG/ML IM injection 30 mg given in UC for acute on chronic migraine headache. - Follow-up with neurology as scheduled in December.  If you run out of sumatriptan  prior to follow-up with neurology and are unable to see primary care physician for refill, follow-up in urgent care for reevaluation and refill of medication. -Continue to monitor symptoms for any change in severity if there is any escalation of current symptoms or development of new symptoms follow-up in ER for further evaluation and management.

## 2024-07-16 NOTE — ED Triage Notes (Signed)
 Patient reports having migraines now for about a year, now having them every day, the pain is getting to a 8-9 daily. It is now affecting my vision sometimes, recently went to the ED. Rx of Imitrex  by ED, here to be seen for a refill of that.

## 2024-09-02 NOTE — Progress Notes (Deleted)
 Initial neurology clinic note  Brianna Dawson MRN: [id number] DOB: 1996/05/31  Referring provider: Yolande Lamar BROCKS, MD  Primary care provider: Eveline Lynwood MATSU, MD  Reason for consult:  headaches  Subjective:  This is [name], a 28 y.o. ***-handed female with a medical history of *** who presents to neurology clinic with ***. The patient is accompanied by ***.  *** Has been to ED from headache on 06/21/24 and 07/16/24 For 1 year? Left sided Blurry vision Migraine almost every day Was taking 8 goodies powder a day On sumatriptan  50 mg or 100 mg prn? Ever a preventative?  Any medical history? Asthma?  Smoker: OCP use: Caffiene use: EtOH use: Restrictive diet: Family history of neurologic disease including headaches:   MEDICATIONS:  Outpatient Encounter Medications as of 09/14/2024  Medication Sig   albuterol  (VENTOLIN  HFA) 108 (90 Base) MCG/ACT inhaler Inhale 1-2 puffs into the lungs every 6 (six) hours as needed for wheezing or shortness of breath.   Aspirin-Acetaminophen -Caffeine (GOODYS EXTRA STRENGTH PO) Take 1-2 Packages by mouth every 4 (four) hours as needed (Pain).   SUMAtriptan  (IMITREX ) 100 MG tablet Take 1 tablet (100 mg total) by mouth every 2 (two) hours as needed for migraine. May repeat in 2 hours if headache persists or recurs. No more than 4 doses in 24 hrs.   SUMAtriptan  (IMITREX ) 50 MG tablet Take 1 tablet (50 mg total) by mouth every 2 (two) hours as needed for migraine (May take up to 4 tablets a day). May repeat in 2 hours if headache persists or recurs.   UNABLE TO FIND Med Name: Good Powder   No facility-administered encounter medications on file as of 09/14/2024.    PAST MEDICAL HISTORY: Past Medical History:  Diagnosis Date   Medical history non-contributory     PAST SURGICAL HISTORY: Past Surgical History:  Procedure Laterality Date   NO PAST SURGERIES      ALLERGIES: No Known Allergies  FAMILY HISTORY: Family History   Problem Relation Age of Onset   Hypertension Maternal Grandfather     SOCIAL HISTORY: Social History   Tobacco Use   Smoking status: Some Days    Types: Cigars    Last attempt to quit: 01/22/2020    Years since quitting: 4.6   Smokeless tobacco: Never   Tobacco comments:    2 cigars a day  Vaping Use   Vaping status: Never Used  Substance Use Topics   Alcohol use: Not Currently    Comment: socially, occasionally   Drug use: Not Currently    Types: Marijuana    Comment: last time several years ago   Social History   Social History Narrative   Not on file    Objective:  Vital Signs:  There were no vitals taken for this visit.  ***  Labs and Imaging review: Internal labs: 06/21/24: CMP unremarkable CBC w/ diff significant for MCV 75.5  HbA1c (02/29/20): 5.5  External labs: ***  Imaging/Procedures: CT head wo contrast (09/06/22 for headaches): FINDINGS: Brain: No intracranial hemorrhage, mass effect, or evidence of acute infarct. No hydrocephalus. No extra-axial fluid collection.   Vascular: No hyperdense vessel or unexpected calcification.   Skull: No fracture or focal lesion.   Sinuses/Orbits: No acute finding. Paranasal sinuses and mastoid air cells are well aerated.   Other: None.   IMPRESSION: No CT correlate for headache. ***  Assessment/Plan:  Brianna Dawson is a 28 y.o. female who presents for evaluation of ***. *** has a relevant  medical history of ***. *** neurological examination is pertinent for ***. Available diagnostic data is significant for ***. This constellation of symptoms and objective data would most likely localize to ***. ***Medication overuse  PLAN: -Blood work: *** ***  -Return to clinic ***  The impression above as well as the plan as outlined below were extensively discussed with the patient (in the company of ***) who voiced understanding. All questions were answered to their satisfaction.  The patient was counseled on  pertinent fall precautions per the printed material provided today, and as noted under the Patient Instructions section below.***  When available, results of the above investigations and possible further recommendations will be communicated to the patient via telephone/MyChart. Patient to call office if not contacted after expected testing turnaround time.   Total time spent reviewing records, interview, history/exam, documentation, and coordination of care on day of encounter:  *** min   Thank you for allowing me to participate in patient's care.  If I can answer any additional questions, I would be pleased to do so.  Venetia Potters, MD   CC: Eveline Lynwood MATSU, MD [address] [place] [address]  CC: Referring provider: Yolande Lamar BROCKS, MD [address] [place],  [place] [address]

## 2024-09-10 ENCOUNTER — Ambulatory Visit
Admission: EM | Admit: 2024-09-10 | Discharge: 2024-09-10 | Disposition: A | Discharge status: Home / Self Care | Payer: Self-pay | Attending: Internal Medicine | Admitting: Internal Medicine

## 2024-09-10 DIAGNOSIS — Z3201 Encounter for pregnancy test, result positive: Secondary | ICD-10-CM

## 2024-09-10 DIAGNOSIS — G43909 Migraine, unspecified, not intractable, without status migrainosus: Secondary | ICD-10-CM

## 2024-09-10 LAB — POCT URINE PREGNANCY: Preg Test, Ur: POSITIVE — AB

## 2024-09-10 NOTE — Discharge Instructions (Addendum)
 Urine pregnancy test today is positive.  Verapamil that you are currently taking for migraines is a medication that we would recommend holding until you discussed with OB/GYN.  Recommend following up with them as soon as possible to discuss this as this medication has been controlling your migraines adequately.  We have given you a list of safe medications for pregnancy.  Blood work sent off today for hCG quantitative which will be available on your MyChart in the next 24 to 48 hours.  If you have any worsening of migraine, abdominal pain, vaginal discharge, vaginal bleeding then can go to the MAU at [facility].  Can follow-up at urgent care as needed.

## 2024-09-10 NOTE — ED Provider Notes (Signed)
 [name]    CSN: [id number] Arrival date & time: 09/10/24  0810      History   Chief Complaint Chief Complaint  Patient presents with   Headache   Possible Pregnancy    HPI Brianna Dawson is a 28 y.o. female.   28 year old female who presents urgent care requesting pregnancy testing and to discuss her migraine medication.  She reports that her last period was supposed to come at the first part of October.  She did take test at home that were positive.  She was concerned that she is taking a medication for her migraines and was unsure if this medication was appropriate.  She has taken verapamil twice daily which has been controlling her migraines nicely.  She does not use Imitrex  as her migraines have been well-controlled.  She has an appointment with neurology but not until January.  She was not expecting to get pregnant.  She would like to know how far along she is.  She is not currently having any headache symptoms or other associated symptoms.   Headache Associated symptoms: no abdominal pain, no back pain, no cough, no ear pain, no eye pain, no fever, no seizures, no sore throat and no vomiting   Possible Pregnancy Associated symptoms include headaches (None at current). Pertinent negatives include no chest pain, no abdominal pain and no shortness of breath.    Past Medical History:  Diagnosis Date   Medical history non-contributory     Patient Active Problem List   Diagnosis Date Noted   Presence of 52 mg levonorgestrel -releasing intrauterine device (IUD) 09/10/2020    Past Surgical History:  Procedure Laterality Date   NO PAST SURGERIES      OB History     Gravida  3   Para  3   Term  3   Preterm  0   AB  0   Living  3      SAB  0   IAB  0   Ectopic  0   Multiple  0   Live Births  3            Home Medications    Prior to Admission medications   Medication Sig Start Date End Date Taking? Authorizing Provider   albuterol  (VENTOLIN  HFA) 108 (90 Base) MCG/ACT inhaler Inhale 1-2 puffs into the lungs every 6 (six) hours as needed for wheezing or shortness of breath. 03/12/24   Akeila Lana, Almarie LABOR, PA-C  Aspirin-Acetaminophen -Caffeine (GOODYS EXTRA STRENGTH PO) Take 1-2 Packages by mouth every 4 (four) hours as needed (Pain).    [provider]  SUMAtriptan  (IMITREX ) 100 MG tablet Take 1 tablet (100 mg total) by mouth every 2 (two) hours as needed for migraine. May repeat in 2 hours if headache persists or recurs. No more than 4 doses in 24 hrs. 07/16/24   Reddick, Johnathan B, NP  SUMAtriptan  (IMITREX ) 50 MG tablet Take 1 tablet (50 mg total) by mouth every 2 (two) hours as needed for migraine (May take up to 4 tablets a day). May repeat in 2 hours if headache persists or recurs. 06/21/24 06/22/25  Flint Sonny POUR, PA-C  UNABLE TO FIND Med Name: Good Powder    [provider]    Family History Family History  Problem Relation Age of Onset   Hypertension Maternal Grandfather     Social History Social History   Tobacco Use   Smoking status: Some Days    Types: Cigars  Last attempt to quit: 01/22/2020    Years since quitting: 4.6   Smokeless tobacco: Never   Tobacco comments:    2 cigars a day  Vaping Use   Vaping status: Never Used  Substance Use Topics   Alcohol use: Not Currently    Comment: socially, occasionally   Drug use: Not Currently    Types: Marijuana    Comment: last time several years ago     Allergies   Patient has no known allergies.   Review of Systems Review of Systems  Constitutional:  Negative for chills and fever.  HENT:  Negative for ear pain and sore throat.   Eyes:  Negative for pain and visual disturbance.  Respiratory:  Negative for cough and shortness of breath.   Cardiovascular:  Negative for chest pain and palpitations.  Gastrointestinal:  Negative for abdominal pain and vomiting.  Genitourinary:  Negative for dysuria and hematuria.   Musculoskeletal:  Negative for arthralgias and back pain.  Skin:  Negative for color change and rash.  Neurological:  Positive for headaches (None at current). Negative for seizures and syncope.  All other systems reviewed and are negative.    Physical Exam Triage Vital Signs ED Triage Vitals  Encounter Vitals Group     BP 09/10/24 0834 121/66     Girls Systolic BP Percentile --      Girls Diastolic BP Percentile --      Boys Systolic BP Percentile --      Boys Diastolic BP Percentile --      Pulse Rate 09/10/24 0834 83     Resp 09/10/24 0834 18     Temp 09/10/24 0834 98.4 F (36.9 C)     Temp Source 09/10/24 0834 Oral     SpO2 09/10/24 0834 96 %     Weight 09/10/24 0832 155 lb (70.3 kg)     Height 09/10/24 0832 5' 5 (1.651 m)     Head Circumference --      Peak Flow --      Pain Score 09/10/24 0826 0     Pain Loc --      Pain Education --      Exclude from Growth Chart --    No data found.  Updated Vital Signs BP 121/66 (BP Location: Left Arm)   Pulse 83   Temp 98.4 F (36.9 C) (Oral)   Resp 18   Ht 5' 5 (1.651 m)   Wt 155 lb (70.3 kg)   LMP 08/03/2024 (Exact Date)   SpO2 96%   BMI 25.79 kg/m   Visual Acuity Right Eye Distance:   Left Eye Distance:   Bilateral Distance:    Right Eye Near:   Left Eye Near:    Bilateral Near:     Physical Exam Vitals and nursing note reviewed.  Constitutional:      General: She is not in acute distress.    Appearance: She is well-developed.  HENT:     Head: Normocephalic and atraumatic.  Eyes:     Conjunctiva/sclera: Conjunctivae normal.  Cardiovascular:     Rate and Rhythm: Normal rate and regular rhythm.     Heart sounds: No murmur heard. Pulmonary:     Effort: Pulmonary effort is normal. No respiratory distress.     Breath sounds: Normal breath sounds.  Abdominal:     Palpations: Abdomen is soft.     Tenderness: There is no abdominal tenderness.  Musculoskeletal:        General: No  swelling.      Cervical back: Neck supple.  Skin:    General: Skin is warm and dry.     Capillary Refill: Capillary refill takes less than 2 seconds.  Neurological:     Mental Status: She is alert.     Cranial Nerves: No cranial nerve deficit or facial asymmetry.     Coordination: Coordination normal.     Gait: Gait normal.  Psychiatric:        Mood and Affect: Mood normal.      UC Treatments / Results  Labs (all labs ordered are listed, but only abnormal results are displayed) Labs Reviewed  POCT URINE PREGNANCY - Abnormal; Notable for the following components:      Result Value   Preg Test, Ur Positive (*)    All other components within normal limits  BETA HCG QUANT (REF LAB)    EKG   Radiology No results found.  Procedures Procedures (including critical care time)  Medications Ordered in UC Medications - No data to display  Initial Impression / Assessment and Plan / UC Course  I have reviewed the triage vital signs and the nursing notes.  Pertinent labs & imaging results that were available during my care of the patient were reviewed by me and considered in my medical decision making (see chart for details).     Positive pregnancy test - Plan: hCG Quantitative, hCG Quantitative  Migraine syndrome   Urine pregnancy test today is positive.  Verapamil that you are currently taking for migraines is a medication that we would recommend holding until you discussed with OB/GYN.  Recommend following up with them as soon as possible to discuss this as this medication has been controlling your migraines adequately.  We have given you a list of safe medications for pregnancy.  Blood work sent off today for hCG quantitative which will be available on your MyChart in the next 24 to 48 hours.  If you have any worsening of migraine, abdominal pain, vaginal discharge, vaginal bleeding then can go to the MAU at [facility]  Can follow-up at urgent care as needed.  Final Clinical  Impressions(s) / UC Diagnoses   Final diagnoses:  Positive pregnancy test  Migraine syndrome     Discharge Instructions      Urine pregnancy test today is positive.  Verapamil that you are currently taking for migraines is a medication that we would recommend holding until you discussed with OB/GYN.  Recommend following up with them as soon as possible to discuss this as this medication has been controlling your migraines adequately.  We have given you a list of safe medications for pregnancy.  Blood work sent off today for hCG quantitative which will be available on your MyChart in the next 24 to 48 hours.  If you have any worsening of migraine, abdominal pain, vaginal discharge, vaginal bleeding then can go to the MAU at [facility].  Can follow-up at urgent care as needed.    ED Prescriptions   None    PDMP not reviewed this encounter.   Teresa Almarie LABOR, [place] 09/10/24 [phone]

## 2024-09-10 NOTE — ED Triage Notes (Signed)
 Patient reports taking 2 @ home pregnancy tests and they were +. Also reports ha's not improving recently with current meds but if pregnant what would be best.

## 2024-09-11 LAB — BETA HCG QUANT (REF LAB): hCG Quant: 214 m[IU]/mL

## 2024-09-13 ENCOUNTER — Ambulatory Visit: Admit: 2024-09-13 | Payer: Self-pay

## 2024-09-14 ENCOUNTER — Inpatient Hospital Stay (HOSPITAL_COMMUNITY)
Admission: AD | Admit: 2024-09-14 | Discharge: 2024-09-15 | Disposition: A | Discharge status: Home / Self Care | Payer: MEDICAID | Attending: Obstetrics & Gynecology | Admitting: Obstetrics & Gynecology

## 2024-09-14 ENCOUNTER — Ambulatory Visit: Payer: Self-pay | Admitting: Neurology

## 2024-09-14 ENCOUNTER — Inpatient Hospital Stay (HOSPITAL_COMMUNITY): Payer: MEDICAID

## 2024-09-14 ENCOUNTER — Encounter (HOSPITAL_COMMUNITY): Payer: Self-pay | Admitting: Obstetrics & Gynecology

## 2024-09-14 DIAGNOSIS — N760 Acute vaginitis: Secondary | ICD-10-CM

## 2024-09-14 DIAGNOSIS — B9689 Other specified bacterial agents as the cause of diseases classified elsewhere: Secondary | ICD-10-CM

## 2024-09-14 DIAGNOSIS — O4691 Antepartum hemorrhage, unspecified, first trimester: Secondary | ICD-10-CM | POA: Diagnosis present

## 2024-09-14 DIAGNOSIS — O23591 Infection of other part of genital tract in pregnancy, first trimester: Secondary | ICD-10-CM | POA: Diagnosis not present

## 2024-09-14 DIAGNOSIS — Z3A01 Less than 8 weeks gestation of pregnancy: Secondary | ICD-10-CM

## 2024-09-14 DIAGNOSIS — O3680X0 Pregnancy with inconclusive fetal viability, not applicable or unspecified: Secondary | ICD-10-CM

## 2024-09-14 LAB — URINALYSIS, ROUTINE W REFLEX MICROSCOPIC
Bacteria, UA: NONE SEEN
Bilirubin Urine: NEGATIVE
Glucose, UA: NEGATIVE mg/dL
Ketones, ur: NEGATIVE mg/dL
Leukocytes,Ua: NEGATIVE
Nitrite: NEGATIVE
Protein, ur: NEGATIVE mg/dL
Specific Gravity, Urine: 1.03 (ref 1.005–1.030)
pH: 5 (ref 5.0–8.0)

## 2024-09-14 LAB — WET PREP, GENITAL
Sperm: NONE SEEN
Trich, Wet Prep: NONE SEEN
WBC, Wet Prep HPF POC: <10 (ref <10)
Yeast Wet Prep HPF POC: NONE SEEN

## 2024-09-14 LAB — CBC
HCT: 37.5 % (ref 36.0–46.0)
Hemoglobin: 12.4 g/dL (ref 12.0–15.0)
MCH: 25 pg — ABNORMAL LOW (ref 26.0–34.0)
MCHC: 33.1 g/dL (ref 30.0–36.0)
MCV: 75.6 fL — ABNORMAL LOW (ref 80.0–100.0)
Platelets: 182 K/uL (ref 150–400)
RBC: 4.96 MIL/uL (ref 3.87–5.11)
RDW: 14.6 % (ref 11.5–15.5)
WBC: 8 K/uL (ref 4.0–10.5)
nRBC: 0 % (ref 0.0–0.2)

## 2024-09-14 LAB — HCG, QUANTITATIVE, PREGNANCY: hCG, Beta Chain, Quant, S: 615 m[IU]/mL — ABNORMAL HIGH (ref <5)

## 2024-09-14 MED ORDER — METRONIDAZOLE 500 MG PO TABS: 500.0000 mg | ORAL_TABLET | Freq: Two times a day (BID) | ORAL | 0 refills | Status: AC

## 2024-09-14 NOTE — MAU Provider Note (Signed)
 History     [id number]  Arrival date and time: 09/14/24 2220    Chief Complaint  Patient presents with   Vaginal Bleeding     HPI Jeremiah Tarpley is a 28 y.o. at [redacted]w[redacted]d by LMP, who presents for vaginal bleeding. She was seen at urgent care and had a positive UPT at that time. Vaginal bleeding started yesterday morning. She noticed light pink smear when she wiped. Throughout the day yesterday and into today the bleeding has gotten a little heavier and is more bright read. She sees drops on the pad but it is not consistent. She is not currently bleeding. She denies abdominal pain or cramping. She denies dysuria, increased urinary frequency or urgency.       Past Medical History:  Diagnosis Date   Medical history non-contributory     Past Surgical History:  Procedure Laterality Date   NO PAST SURGERIES      Family History  Problem Relation Age of Onset   Hypertension Maternal Grandfather     Social History   Socioeconomic History   Marital status: Single    Spouse name: Not on file   Number of children: Not on file   Years of education: Not on file   Highest education level: Not on file  Occupational History   Not on file  Tobacco Use   Smoking status: Some Days    Types: Cigars    Last attempt to quit: 01/22/2020    Years since quitting: 4.6   Smokeless tobacco: Never   Tobacco comments:    2 cigars a day  Vaping Use   Vaping status: Never Used  Substance and Sexual Activity   Alcohol use: Not Currently    Comment: socially, occasionally   Drug use: Not Currently    Types: Marijuana    Comment: last time several years ago   Sexual activity: Yes    Birth control/protection: None  Other Topics Concern   Not on file  Social History Narrative   Not on file   Social Drivers of Health   Financial Resource Strain: Low Risk  (02/29/2020)   Overall Financial Resource Strain (CARDIA)    Difficulty of Paying Living Expenses: Not hard at all  Food Insecurity: No  Food Insecurity (02/29/2020)   Hunger Vital Sign    Worried About Running Out of Food in the Last Year: Never true    Ran Out of Food in the Last Year: Never true  Transportation Needs: No Transportation Needs (02/29/2020)   PRAPARE - Administrator, Civil Service (Medical): No    Lack of Transportation (Non-Medical): No  Physical Activity: Not on file  Stress: Not on file  Social Connections: Not on file  Intimate Partner Violence: Not At Risk (02/29/2020)   Humiliation, Afraid, Rape, and Kick questionnaire    Fear of Current or Ex-Partner: No    Emotionally Abused: No    Physically Abused: No    Sexually Abused: No    No Known Allergies  No current facility-administered medications on file prior to encounter.   Current Outpatient Medications on File Prior to Encounter  Medication Sig Dispense Refill   albuterol  (VENTOLIN  HFA) 108 (90 Base) MCG/ACT inhaler Inhale 1-2 puffs into the lungs every 6 (six) hours as needed for wheezing or shortness of breath. 1 each 0   Aspirin-Acetaminophen -Caffeine (GOODYS EXTRA STRENGTH PO) Take 1-2 Packages by mouth every 4 (four) hours as needed (Pain).     SUMAtriptan  (IMITREX ) 100 MG  tablet Take 1 tablet (100 mg total) by mouth every 2 (two) hours as needed for migraine. May repeat in 2 hours if headache persists or recurs. No more than 4 doses in 24 hrs. 20 tablet 1   SUMAtriptan  (IMITREX ) 50 MG tablet Take 1 tablet (50 mg total) by mouth every 2 (two) hours as needed for migraine (May take up to 4 tablets a day). May repeat in 2 hours if headache persists or recurs. 30 tablet 0   UNABLE TO FIND Med Name: Good Powder      Pertinent positives and negative per HPI, all others reviewed and negative  Physical Exam   BP 116/70 (BP Location: Right Arm)   Pulse 89   Temp 99.5 F (37.5 C) (Oral)   Resp 18   Ht 5' 5 (1.651 m)   Wt 72.3 kg   LMP 08/03/2024 (Exact Date)   SpO2 99%   BMI 26.54 kg/m   Patient Vitals for the past 24  hrs:  BP Temp Temp src Pulse Resp SpO2 Height Weight  09/14/24 2243 116/70 99.5 F (37.5 C) Oral 89 18 99 % 5' 5 (1.651 m) 72.3 kg    Physical Exam Vitals and nursing note reviewed.  Constitutional:      Appearance: She is well-developed.  HENT:     Head: Normocephalic and atraumatic.     Mouth/Throat:     Mouth: Mucous membranes are moist.  Eyes:     Extraocular Movements: Extraocular movements intact.  Cardiovascular:     Rate and Rhythm: Normal rate and regular rhythm.  Pulmonary:     Effort: Pulmonary effort is normal.  Abdominal:     Palpations: Abdomen is soft.     Tenderness: There is no abdominal tenderness.  Skin:    Capillary Refill: Capillary refill takes less than 2 seconds.  Neurological:     General: No focal deficit present.     Mental Status: She is alert.     Labs Results for orders placed or performed during the hospital encounter of 09/14/24 (from the past 24 hours)  Urinalysis, Routine w reflex microscopic -Urine, Clean Catch     Status: Abnormal   Collection Time: 09/14/24 10:24 PM  Result Value Ref Range   Color, Urine YELLOW YELLOW   APPearance HAZY (A) CLEAR   Specific Gravity, Urine 1.030 1.005 - 1.030   pH 5.0 5.0 - 8.0   Glucose, UA NEGATIVE NEGATIVE mg/dL   Hgb urine dipstick MODERATE (A) NEGATIVE   Bilirubin Urine NEGATIVE NEGATIVE   Ketones, ur NEGATIVE NEGATIVE mg/dL   Protein, ur NEGATIVE NEGATIVE mg/dL   Nitrite NEGATIVE NEGATIVE   Leukocytes,Ua NEGATIVE NEGATIVE   RBC / HPF 0-5 0 - 5 RBC/hpf   WBC, UA 0-5 0 - 5 WBC/hpf   Bacteria, UA NONE SEEN NONE SEEN   Squamous Epithelial / HPF 0-5 0 - 5 /HPF   Mucus PRESENT   CBC     Status: Abnormal   Collection Time: 09/14/24 10:37 PM  Result Value Ref Range   WBC 8.0 4.0 - 10.5 K/uL   RBC 4.96 3.87 - 5.11 MIL/uL   Hemoglobin 12.4 12.0 - 15.0 g/dL   HCT 62.4 63.9 - 53.9 %   MCV 75.6 (L) 80.0 - 100.0 fL   MCH 25.0 (L) 26.0 - 34.0 pg   MCHC 33.1 30.0 - 36.0 g/dL   RDW 85.3 88.4 - 84.4  %   Platelets 182 150 - 400 K/uL   nRBC 0.0 0.0 -  0.2 %  hCG, quantitative, pregnancy     Status: Abnormal   Collection Time: 09/14/24 10:37 PM  Result Value Ref Range   hCG, Beta Chain, Quant, S 615 (H) <5 mIU/mL  Wet prep, genital     Status: Abnormal   Collection Time: 09/14/24 10:51 PM  Result Value Ref Range   Yeast Wet Prep HPF POC NONE SEEN NONE SEEN   Trich, Wet Prep NONE SEEN NONE SEEN   Clue Cells Wet Prep HPF POC PRESENT (A) NONE SEEN   WBC, Wet Prep HPF POC <10 <10   Sperm NONE SEEN     Imaging US  OB LESS THAN 14 WEEKS WITH OB TRANSVAGINAL Result Date: 09/14/2024 EXAM: OBSTETRIC ULTRASOUND FIRST TRIMESTER TECHNIQUE: Transvaginal first trimester obstetric pelvic duplex ultrasound was performed with real-time imaging and color flow Doppler imaging. COMPARISON: None provided. CLINICAL HISTORY: Pregnancy, location unknown. FINDINGS: UTERUS: No focal myometrial mass. GESTATIONAL SAC(S): No intrauterine pregnancy (IUP) is visualized. YOLK SAC: Not visualized. EMBRYO(<11WK) /FETUS(>=11WK): Not visualized. CROWN RUMP LENGTH: Not measured. RATE OF CARDIAC ACTIVITY: Not measured. RIGHT OVARY: Unremarkable. LEFT OVARY: Unremarkable. FREE FLUID: No free fluid. IMPRESSION: 1. No IUP is visualized. Pregnancy of unknown location. 2. Differential diagnosis includes nonvisualized early intrauterine pregnancy, nonvisualized ectopic pregnancy, or early pregnancy loss that has completely passed. 3. Recommend follow-up with serial beta hCG and repeat ultrasound as indicated. Electronically signed by: Pinkie Pebbles MD 09/14/2024 11:19 PM EST RP Workstation: [id number]    MAU Course  Procedures  Lab Orders         Wet prep, genital         CBC         hCG, quantitative, pregnancy         Urinalysis, Routine w reflex microscopic -Urine, Clean Catch    Meds ordered this encounter  Medications   metroNIDAZOLE  (FLAGYL ) 500 MG tablet    Sig: Take 1 tablet (500 mg total) by mouth 2 (two) times  daily for 7 days.    Dispense:  14 tablet    Refill:  0    Imaging Orders         US  OB LESS THAN 14 WEEKS WITH OB TRANSVAGINAL     MDM Moderate (Level 3-4)  Assessment and Plan  [redacted] weeks gestation of pregnancy  Bacterial vaginosis  Pregnancy, location unknown   Amillia Biffle is a 28 y.o. at [redacted]w[redacted]d by LMP, who presents for vaginal bleeding.  -Concern for failed pregnancy vs ectopic pregnancy -HCG at urgent care 11/8 214. HCG today 615. -US  shows no IUP with clear adenexa. -Stable Hgb on CBC -Wet prep positive for clue cells  -Stable for discharge home -Rx sent for metronidazole  for treatment of BV.  -Appointment set up in MAU for repeat HCG in 48 hours -All questions answered, ectopic precautions provided.     Taylee Gunnells L Ravneet Spilker, MD/MHA 09/14/24 11:37 PM  Allergies as of 09/14/2024   No Known Allergies      Medication List     TAKE these medications    albuterol  108 (90 Base) MCG/ACT inhaler Commonly known as: VENTOLIN  HFA Inhale 1-2 puffs into the lungs every 6 (six) hours as needed for wheezing or shortness of breath.   GOODYS EXTRA STRENGTH PO Take 1-2 Packages by mouth every 4 (four) hours as needed (Pain).   metroNIDAZOLE  500 MG tablet Commonly known as: FLAGYL  Take 1 tablet (500 mg total) by mouth 2 (two) times daily for 7 days.   SUMAtriptan  50 MG tablet  Commonly known as: Imitrex  Take 1 tablet (50 mg total) by mouth every 2 (two) hours as needed for migraine (May take up to 4 tablets a day). May repeat in 2 hours if headache persists or recurs.   SUMAtriptan  100 MG tablet Commonly known as: Imitrex  Take 1 tablet (100 mg total) by mouth every 2 (two) hours as needed for migraine. May repeat in 2 hours if headache persists or recurs. No more than 4 doses in 24 hrs.   UNABLE TO FIND Med Name: Good Powder

## 2024-09-14 NOTE — MAU Note (Addendum)
..  Brianna Dawson is a 28 y.o. at [redacted]w[redacted]d here in MAU reporting: vaginal bleeding that started yesterday morning, started light pink and now it is a brighter red blood. The bleeding is mostly when she wipes but she does get some on her pad.  Denies pain. LMP: 08/03/2024 Pain score: 0/10 Vitals:   09/14/24 2243  BP: 116/70  Pulse: 89  Resp: 18  Temp: 99.5 F (37.5 C)  SpO2: 99%     FHT:n/a Lab orders placed from triage:  already in

## 2024-09-15 ENCOUNTER — Ambulatory Visit (HOSPITAL_COMMUNITY): Payer: Self-pay

## 2024-09-15 LAB — GC/CHLAMYDIA PROBE AMP (~~LOC~~) NOT AT ARMC
Chlamydia: NEGATIVE
Comment: NEGATIVE
Comment: NORMAL
Neisseria Gonorrhea: NEGATIVE

## 2024-09-16 ENCOUNTER — Inpatient Hospital Stay (HOSPITAL_COMMUNITY)
Admission: AD | Admit: 2024-09-16 | Discharge: 2024-09-17 | Disposition: A | Discharge status: Home / Self Care | Payer: MEDICAID | Attending: Obstetrics and Gynecology | Admitting: Obstetrics and Gynecology

## 2024-09-16 DIAGNOSIS — N939 Abnormal uterine and vaginal bleeding, unspecified: Secondary | ICD-10-CM

## 2024-09-16 DIAGNOSIS — O200 Threatened abortion: Secondary | ICD-10-CM | POA: Diagnosis not present

## 2024-09-16 DIAGNOSIS — Z3A00 Weeks of gestation of pregnancy not specified: Secondary | ICD-10-CM | POA: Insufficient documentation

## 2024-09-16 DIAGNOSIS — O3680X0 Pregnancy with inconclusive fetal viability, not applicable or unspecified: Secondary | ICD-10-CM | POA: Insufficient documentation

## 2024-09-16 DIAGNOSIS — R7989 Other specified abnormal findings of blood chemistry: Secondary | ICD-10-CM

## 2024-09-16 LAB — CBC
HCT: 37 % (ref 36.0–46.0)
Hemoglobin: 12.2 g/dL (ref 12.0–15.0)
MCH: 25.2 pg — ABNORMAL LOW (ref 26.0–34.0)
MCHC: 33 g/dL (ref 30.0–36.0)
MCV: 76.3 fL — ABNORMAL LOW (ref 80.0–100.0)
Platelets: 185 K/uL (ref 150–400)
RBC: 4.85 MIL/uL (ref 3.87–5.11)
RDW: 14.4 % (ref 11.5–15.5)
WBC: 8.7 K/uL (ref 4.0–10.5)
nRBC: 0 % (ref 0.0–0.2)

## 2024-09-16 NOTE — MAU Note (Signed)
 Pt says she has been having VB - started Tuesday . Came to MAU on Wed - told to retn tonight . VB now- pad in Triage - scant amt light red . No pain - no cramp

## 2024-09-16 NOTE — MAU Provider Note (Incomplete)
 S [name] is a 28 y.o. [phone] patient who presents to MAU today with complaint of patient states she is here for a repeat beta-hCG. She states she started having vaginal bleeding that started on Tuesday, 09/13/2024 and was seen in the MAU on 09/14/2024.  Ultrasound at that time showed no IUP visible and recommendations were to trend the hCG levels with precautions for excessive vaginal bleeding, ectopic precautions, possible miscarriage due to pregnancy of unknown location.    Patient reports that today she has scant amount of light red blood on her pad with no abdominal cramping and no active red vaginal bleeding.  The remainder of the ROS is negative unless otherwise noted above in HPI   O BP 99/60 (BP Location: Right Arm)   Pulse 99   Temp 98.8 F (37.1 C) (Oral)   Resp 12   Ht 5' 5.5 (1.664 m)   Wt 71.9 kg   LMP 08/03/2024 (Exact Date)   BMI 25.97 kg/m  Physical Exam     HCG TREND 11/8: 214 11/12: 615  Study Result  ( Copied from report on 09/14/24)  Narrative & Impression  EXAM: OBSTETRIC ULTRASOUND FIRST TRIMESTER   TECHNIQUE: Transvaginal first trimester obstetric pelvic duplex ultrasound was performed with real-time imaging and color flow Doppler imaging.   COMPARISON: None provided.   CLINICAL HISTORY: Pregnancy, location unknown.   FINDINGS:   UTERUS: No focal myometrial mass.   GESTATIONAL SAC(S): No intrauterine pregnancy (IUP) is visualized.   YOLK SAC: Not visualized.   EMBRYO(<11WK) /FETUS(>=11WK): Not visualized.   CROWN RUMP LENGTH: Not measured.   RATE OF CARDIAC ACTIVITY: Not measured.   RIGHT OVARY: Unremarkable.   LEFT OVARY: Unremarkable.   FREE FLUID: No free fluid.   IMPRESSION: 1. No IUP is visualized. Pregnancy of unknown location. 2. Differential diagnosis includes nonvisualized early intrauterine pregnancy, nonvisualized ectopic pregnancy, or early pregnancy loss that has completely passed. 3.  Recommend follow-up with serial beta hCG and repeat ultrasound as indicated.   Electronically signed by: Pinkie Pebbles MD 09/14/2024 11:19 PM EST RP Workstation: [id number]    MDM  MODERATE   CBC: NM  HCG TREND 11/8: 214 11/12: 615 11/14:    Orders Placed This Encounter  Procedures  . hCG, quantitative, pregnancy    Standing Status:   Standing    Number of Occurrences:   1  . CBC    Standing Status:   Standing    Number of Occurrences:   1      Results for orders placed or performed during the hospital encounter of 09/16/24 (from the past 24 hours)  CBC     Status: Abnormal   Collection Time: 09/16/24 11:23 PM  Result Value Ref Range   WBC 8.7 4.0 - 10.5 K/uL   RBC 4.85 3.87 - 5.11 MIL/uL   Hemoglobin 12.2 12.0 - 15.0 g/dL   HCT 62.9 63.9 - 53.9 %   MCV 76.3 (L) 80.0 - 100.0 fL   MCH 25.2 (L) 26.0 - 34.0 pg   MCHC 33.0 30.0 - 36.0 g/dL   RDW 85.5 88.4 - 84.4 %   Platelets 185 150 - 400 K/uL   nRBC 0.0 0.0 - 0.2 %       I have reviewed the patient chart and performed the physical exam . I have ordered & interpreted the lab results and reviewed and interpreted the *** Medications ordered as stated below.  A/P as described below.  Counseling and education provided  and patient agreeable  with plan as described below. Verbalized understanding.    ASSESSMENT Medical screening exam complete @DX @ @DIAGMED @     PLAN Future Appointments  Date Time Provider Department Center  09/17/2024  8:00 AM [facility]U 1 [facility] None  10/11/2024  2:15 PM [facility] INTAKE [facility]  10/18/2024  2:55 PM Regino, Camie DELENA HOWARD [place] [facility]  11/04/2024  8:00 AM Leigh Venetia CROME, MD [facility] None    Discharge from MAU in stable condition See AVS for full description of educational information and instructions provided to the patient at time of discharge List of options for follow-up given *** Warning signs for worsening condition that would warrant emergency follow-up  discussed Patient may return to MAU as needed   Littie Olam DELENA, NP 09/16/2024 11:58 PM   This chart was dictated using voice recognition software, Dragon. Despite the best efforts of this provider to proofread and correct errors, errors may still occur which can change documentation meaning.

## 2024-09-16 NOTE — MAU Provider Note (Signed)
 S [name] is a 28 y.o. [phone] patient who presents to MAU today with complaint of patient states she is here for a repeat beta-hCG. She states she started having vaginal bleeding that started on Tuesday, 09/13/2024 and was seen in the MAU on 09/14/2024.  Ultrasound at that time showed no IUP visible and recommendations were to trend the hCG levels with precautions for excessive vaginal bleeding, ectopic precautions, possible miscarriage due to pregnancy of unknown location.    Patient reports that today she has scant amount of light red blood on her pad with no abdominal cramping and no active red vaginal bleeding.  The remainder of the ROS is negative unless otherwise noted above in HPI   O BP 99/60 (BP Location: Right Arm)   Pulse 99   Temp 98.8 F (37.1 C) (Oral)   Resp 12   Ht 5' 5.5 (1.664 m)   Wt 71.9 kg   LMP 08/03/2024 (Exact Date)   BMI 25.97 kg/m  Physical Exam Vitals and nursing note reviewed.  Constitutional:      General: She is not in acute distress.    Appearance: Normal appearance. She is not ill-appearing.  HENT:     Head: Normocephalic.     Nose: Nose normal.     Mouth/Throat:     Mouth: Mucous membranes are moist.  Cardiovascular:     Rate and Rhythm: Normal rate.  Pulmonary:     Effort: Pulmonary effort is normal.  Abdominal:     Palpations: Abdomen is soft.     Tenderness: There is no abdominal tenderness.  Musculoskeletal:        General: Normal range of motion.     Cervical back: Normal range of motion.  Skin:    General: Skin is warm.  Neurological:     Mental Status: She is oriented to person, place, and time.  Psychiatric:        Behavior: Behavior normal.     Study Result  ( Copied from report on 09/14/24)  Narrative & Impression  EXAM: OBSTETRIC ULTRASOUND FIRST TRIMESTER   TECHNIQUE: Transvaginal first trimester obstetric pelvic duplex ultrasound was performed with real-time imaging and color flow Doppler imaging.    COMPARISON: None provided.   CLINICAL HISTORY: Pregnancy, location unknown.   FINDINGS:   UTERUS: No focal myometrial mass.   GESTATIONAL SAC(S): No intrauterine pregnancy (IUP) is visualized.   YOLK SAC: Not visualized.   EMBRYO(<11WK) /FETUS(>=11WK): Not visualized.   CROWN RUMP LENGTH: Not measured.   RATE OF CARDIAC ACTIVITY: Not measured.   RIGHT OVARY: Unremarkable.   LEFT OVARY: Unremarkable.   FREE FLUID: No free fluid.   IMPRESSION: 1. No IUP is visualized. Pregnancy of unknown location. 2. Differential diagnosis includes nonvisualized early intrauterine pregnancy, nonvisualized ectopic pregnancy, or early pregnancy loss that has completely passed. 3. Recommend follow-up with serial beta hCG and repeat ultrasound as indicated.   Electronically signed by: Pinkie Pebbles MD 09/14/2024 11:19 PM EST RP Workstation: [id number]    MDM  MODERATE/HIGH  CBC: NM Vital signs stable Hemodynamically stable at this time  HCG TREND 11/8: 214 11/12: 615 11/14: 1,017  Pregnancy of unknown location with inappropriate rise in HCG levels. Patient was recommended to have her HCG levels repeated again in 48 hours (11/17) at [facility] office but the patient stated  I don't have an appointment until December and will come back here to the MAU  I did explain that I sent a message to  the office for an appointment on Monday 11/17 and that she did not need to return to the MAU unless she had heavy vaginal bleeding, severe abdominal cramping , or any additional concerns.    Orders Placed This Encounter  Procedures   hCG, quantitative, pregnancy    Standing Status:   Standing    Number of Occurrences:   1   CBC    Standing Status:   Standing    Number of Occurrences:   1   Discharge patient Discharge disposition: 01-Home or Self Care; Discharge patient date: 09/17/2024    Standing Status:   Standing    Number of Occurrences:   1    Discharge disposition:    01-Home or Self Care [1]    Discharge patient date:   09/17/2024      Results for orders placed or performed during the hospital encounter of 09/16/24 (from the past 24 hours)  hCG, quantitative, pregnancy     Status: Abnormal   Collection Time: 09/16/24 11:23 PM  Result Value Ref Range   hCG, Beta Chain, Quant, S 1,017 (H) <5 mIU/mL  CBC     Status: Abnormal   Collection Time: 09/16/24 11:23 PM  Result Value Ref Range   WBC 8.7 4.0 - 10.5 K/uL   RBC 4.85 3.87 - 5.11 MIL/uL   Hemoglobin 12.2 12.0 - 15.0 g/dL   HCT 62.9 63.9 - 53.9 %   MCV 76.3 (L) 80.0 - 100.0 fL   MCH 25.2 (L) 26.0 - 34.0 pg   MCHC 33.0 30.0 - 36.0 g/dL   RDW 85.5 88.4 - 84.4 %   Platelets 185 150 - 400 K/uL   nRBC 0.0 0.0 - 0.2 %      I have reviewed the patient chart and performed the physical exam . I have ordered & interpreted the lab results and I independently reviewed and interpreted the ultrasound images performed on 09/14/24 and agree with the radiologist findings.  A/P as described below.  Counseling and education provided and patient agreeable  with plan as described below. Verbalized understanding.    ASSESSMENT Medical screening exam complete Pregnancy of unknown anatomic location  Elevated serum hCG  Vaginal bleeding  Threatened miscarriage in early pregnancy     PLAN Future Appointments  Date Time Provider Department Center  09/17/2024  8:00 AM [facility]-MAU 1 [facility] None  10/11/2024  2:15 PM [facility]KE [facility]  10/18/2024  2:55 PM [place], Camie DELENA HOWARD [facility]  11/04/2024  8:00 AM Leigh Venetia CROME, MD [facility] None    Discharge from MAU in stable condition  See AVS for full description of educational information and instructions provided to the patient at time of discharge  Warning signs for worsening condition that would warrant emergency follow-up discussed  Patient may return to MAU as needed   Olam Dalton, MSN, [facility], Center for  [facility]   This chart was dictated using voice recognition software, Dragon. Despite the best efforts of this provider to proofread and correct errors, errors may still occur which can change documentation meaning.

## 2024-09-17 ENCOUNTER — Other Ambulatory Visit (HOSPITAL_COMMUNITY): Payer: Self-pay

## 2024-09-17 LAB — HCG, QUANTITATIVE, PREGNANCY: hCG, Beta Chain, Quant, S: 1017 m[IU]/mL — ABNORMAL HIGH (ref <5)

## 2024-09-17 NOTE — Discharge Instructions (Signed)
 Your pregnancy numbers need to be repeated in 48 hours.  I will send a message to the office to schedule you for an appointment on Monday, 09/19/2024.  If your numbers rise appropriately they should be able to schedule an appointment for you to have a viability ultrasound.     Please return to the MAU if you experience heavy vaginal bleeding, severe abdominal pain, or if you have any concerns related to the pregnancy.

## 2024-09-19 ENCOUNTER — Telehealth: Payer: Self-pay | Admitting: Family Medicine

## 2024-09-19 ENCOUNTER — Inpatient Hospital Stay (HOSPITAL_COMMUNITY)
Admission: AD | Admit: 2024-09-19 | Discharge: 2024-09-19 | Disposition: A | Discharge status: Home / Self Care | Payer: MEDICAID | Attending: Obstetrics and Gynecology | Admitting: Obstetrics and Gynecology

## 2024-09-19 DIAGNOSIS — O209 Hemorrhage in early pregnancy, unspecified: Secondary | ICD-10-CM | POA: Diagnosis not present

## 2024-09-19 DIAGNOSIS — O3680X0 Pregnancy with inconclusive fetal viability, not applicable or unspecified: Secondary | ICD-10-CM

## 2024-09-19 DIAGNOSIS — Z3A01 Less than 8 weeks gestation of pregnancy: Secondary | ICD-10-CM | POA: Diagnosis not present

## 2024-09-19 DIAGNOSIS — N939 Abnormal uterine and vaginal bleeding, unspecified: Secondary | ICD-10-CM

## 2024-09-19 LAB — HCG, QUANTITATIVE, PREGNANCY: hCG, Beta Chain, Quant, S: 266 m[IU]/mL — ABNORMAL HIGH (ref <5)

## 2024-09-19 NOTE — MAU Note (Signed)
 Brianna Dawson is a 28 y.o. at [redacted]w[redacted]d here in MAU reporting: here for repeat HCG levels. States she has had on going VB but it has not gotten any heavier. Denies pain.   Onset of complaint: NA Pain score: 0 Vitals:   09/19/24 1958  BP: 113/67  Pulse: 95  Resp: 16  Temp: 98.8 F (37.1 C)  SpO2: 100%     FHT: NA  Lab orders placed from triage: none

## 2024-09-19 NOTE — MAU Provider Note (Signed)
 None     S [name] is a 28 y.o. [phone] female at [redacted]w[redacted]d who presents to MAU today for repeat hCG. She continues to have vaginal bleeding which initially started on 11/8. She has had hCG trended for inappropriate rise. Denies abdominal pain.  hCG trend: 11/8: 214 11/12: 615 11/14: 1,017  Pertinent items noted in HPI and remainder of comprehensive ROS otherwise negative.   O BP 113/67 (BP Location: Right Arm)   Pulse 95   Temp 98.8 F (37.1 C) (Oral)   Resp 16   Ht 5' 5.5 (1.664 m)   Wt 73 kg   LMP 08/03/2024 (Exact Date)   SpO2 100%   BMI 26.37 kg/m  Physical Exam Vitals reviewed.  Constitutional:      General: She is not in acute distress.    Appearance: She is well-developed. She is not diaphoretic.  Eyes:     General: No scleral icterus. Pulmonary:     Effort: Pulmonary effort is normal. No respiratory distress.  Skin:    General: Skin is warm and dry.  Neurological:     Mental Status: She is alert.     Coordination: Coordination normal.    Results for orders placed or performed during the hospital encounter of 09/19/24 (from the past 24 hours)  hCG, quantitative, pregnancy     Status: Abnormal   Collection Time: 09/19/24  8:21 PM  Result Value Ref Range   hCG, Beta Chain, Quant, S 266 (H) <5 mIU/mL    MDM: Moderate MAU Course: -Vital signs within normal limits. -Repeat hCG today shows decrease to 266. -Discussed with Dr. Izell, recommends repeat hCG in 48 hours in the office to ensure continued decline indicative of miscarriage, then considering trending weekly to 0.  A 1. Pregnancy of unknown anatomic location (Primary) - Discharge patient  2. Vaginal bleeding - Discharge patient  Medical screening exam complete  P Discharge from MAU in stable condition with return precautions Follow up at [facility] as scheduled for repeat hCG  Future Appointments  Date Time Provider Department Center  09/21/2024 10:35 AM [facility]   10/18/2024  2:55 PM Regino Camie LABOR, CNM [facility]  11/04/2024  8:00 AM Leigh Venetia CROME, MD [facility] None   Allergies as of 09/19/2024   No Known Allergies      Medication List     STOP taking these medications    GOODYS EXTRA STRENGTH PO   UNABLE TO FIND       TAKE these medications    albuterol  108 (90 Base) MCG/ACT inhaler Commonly known as: VENTOLIN  HFA Inhale 1-2 puffs into the lungs every 6 (six) hours as needed for wheezing or shortness of breath.   metroNIDAZOLE  500 MG tablet Commonly known as: FLAGYL  Take 1 tablet (500 mg total) by mouth 2 (two) times daily for 7 days.   SUMAtriptan  50 MG tablet Commonly known as: Imitrex  Take 1 tablet (50 mg total) by mouth every 2 (two) hours as needed for migraine (May take up to 4 tablets a day). May repeat in 2 hours if headache persists or recurs.   SUMAtriptan  100 MG tablet Commonly known as: Imitrex  Take 1 tablet (100 mg total) by mouth every 2 (two) hours as needed for migraine. May repeat in 2 hours if headache persists or recurs. No more than 4 doses in 24 hrs.        Joesph LABOR Sear, PA

## 2024-09-19 NOTE — Telephone Encounter (Signed)
 Called patient to schedule for stat hcg in the office today. Patient wants to know why she needs to come to [facility] to get this done and not the hospital. I let the patient know that this is the office she is planning on getting her care and she is aware but she says if we will not be doing anything different than what is done in the MAU she would like to go there because she cannot come in until 9PM. I let the patient know that nothing will be done different and that coming into the office may be a faster process. Patient declined appt and said she will go to MAU tonight.

## 2024-09-19 NOTE — Discharge Instructions (Addendum)
 It is very important that we check your blood work again on Wednesday morning to determine if you have an ectopic pregnancy. This will be at the office on Wednesday, November 19 at 10:35am. You can call them if you need to reschedule for a different time that day.  [facility] [employer] [facility]             [address], [place], [place] [address] [phone]

## 2024-09-21 ENCOUNTER — Ambulatory Visit: Payer: Self-pay

## 2024-09-22 ENCOUNTER — Telehealth: Payer: Self-pay | Admitting: *Deleted

## 2024-09-22 NOTE — Telephone Encounter (Signed)
 Reviewing schedule and noted patient had stat bhcg scheduled for 09/28/25. Clarified with provider needs stat bhcg today or tomorrow. I called De and explained we need to move her lab to today or tomorrow ; she she says she has been coming to doctor every 48 hours and she can't keep doing that with her work. I explained we understand it is difficult with work and that if she can't come during our office hours she can go to [facility] when possible because they are open 24/7. She states she can do that. Will notify MAU of expected visit.  Rock Skip PEAK

## 2024-09-28 ENCOUNTER — Ambulatory Visit: Payer: Self-pay

## 2024-10-06 ENCOUNTER — Ambulatory Visit: Payer: Self-pay | Admitting: Neurology

## 2024-10-11 ENCOUNTER — Telehealth: Payer: Self-pay

## 2024-10-18 ENCOUNTER — Encounter: Payer: Self-pay | Admitting: Certified Nurse Midwife

## 2024-10-21 NOTE — Progress Notes (Deleted)
 "  Initial neurology clinic note  Brianna Dawson MRN: [id number] DOB: 01/09/1996  Referring provider: Yolande Lamar BROCKS, MD  Primary care provider: Patient, No Pcp Per  Reason for consult:  headaches  Subjective:  This is [name], a 28 y.o. ***-handed female with a medical history of *** who presents to neurology clinic with headaches. The patient is accompanied by ***.  *** Has been to ED from headache on 06/21/24 and 07/16/24 For 1 year? Left sided Blurry vision Migraine almost every day Was taking 8 goodies powder a day On sumatriptan  50 mg or 100 mg prn? Ever a preventative?  Now pregnant?***US  showed no visualized IUP on 09/14/24 - pregnancy of unknown location   Any medical history? Asthma?   Smoker: OCP use: Caffiene use: EtOH use: Restrictive diet: Family history of neurologic disease including headaches:   MEDICATIONS:  Outpatient Encounter Medications as of 11/04/2024  Medication Sig   albuterol  (VENTOLIN  HFA) 108 (90 Base) MCG/ACT inhaler Inhale 1-2 puffs into the lungs every 6 (six) hours as needed for wheezing or shortness of breath.   SUMAtriptan  (IMITREX ) 100 MG tablet Take 1 tablet (100 mg total) by mouth every 2 (two) hours as needed for migraine. May repeat in 2 hours if headache persists or recurs. No more than 4 doses in 24 hrs.   SUMAtriptan  (IMITREX ) 50 MG tablet Take 1 tablet (50 mg total) by mouth every 2 (two) hours as needed for migraine (May take up to 4 tablets a day). May repeat in 2 hours if headache persists or recurs.   No facility-administered encounter medications on file as of 11/04/2024.    PAST MEDICAL HISTORY: Past Medical History:  Diagnosis Date   Medical history non-contributory     PAST SURGICAL HISTORY: Past Surgical History:  Procedure Laterality Date   NO PAST SURGERIES      ALLERGIES: Allergies[1]  FAMILY HISTORY: Family History  Problem Relation Age of Onset   Hypertension Maternal Grandfather      SOCIAL HISTORY: Social History[2] Social History   Social History Narrative   Not on file    Objective:  Vital Signs:  LMP 08/03/2024 (Exact Date)   ***  Labs and Imaging review: Internal labs: hCG (09/19/24): elevated to 266  06/21/24: CMP unremarkable CBC w/ diff significant for MCV 75.5   HbA1c (02/29/20): 5.5   External labs: ***   Imaging/Procedures: CT head wo contrast (09/06/22 for headaches): FINDINGS: Brain: No intracranial hemorrhage, mass effect, or evidence of acute infarct. No hydrocephalus. No extra-axial fluid collection.   Vascular: No hyperdense vessel or unexpected calcification.   Skull: No fracture or focal lesion.   Sinuses/Orbits: No acute finding. Paranasal sinuses and mastoid air cells are well aerated.   Other: None.   IMPRESSION: No CT correlate for headache. ***  Assessment/Plan:  Brianna Dawson is a 28 y.o. female who presents for evaluation of ***. *** has a relevant medical history of ***. *** neurological examination is pertinent for ***. Available diagnostic data is significant for ***. This constellation of symptoms and objective data would most likely localize to ***. ***  PLAN: -Blood work: *** ***  -Return to clinic ***  The impression above as well as the plan as outlined below were extensively discussed with the patient (in the company of ***) who voiced understanding. All questions were answered to their satisfaction.  The patient was counseled on pertinent fall precautions per the printed material provided today, and as noted under the Patient Instructions section below.***  When available, results of the above investigations and possible further recommendations will be communicated to the patient via telephone/MyChart. Patient to call office if not contacted after expected testing turnaround time.   Total time spent reviewing records, interview, history/exam, documentation, and coordination of care on day of  encounter:  *** min   Thank you for allowing me to participate in patient's care.  If I can answer any additional questions, I would be pleased to do so.  Venetia Potters, MD   CC: Patient, No Pcp Per No address on file  CC: Referring provider: Yolande Lamar BROCKS, MD [address] [place],  [place] [address]    [1] No Known Allergies [2]  Social History Tobacco Use   Smoking status: Some Days    Types: Cigars    Last attempt to quit: 01/22/2020    Years since quitting: 4.7   Smokeless tobacco: Never   Tobacco comments:    2 cigars a day  Vaping Use   Vaping status: Never Used  Substance Use Topics   Alcohol use: Not Currently    Comment: socially, occasionally   Drug use: Not Currently    Types: Marijuana    Comment: last time several years ago   "

## 2024-11-04 ENCOUNTER — Ambulatory Visit: Payer: Self-pay | Admitting: Neurology

## 2025-02-09 ENCOUNTER — Ambulatory Visit: Payer: Self-pay | Admitting: Neurology
